# Patient Record
Sex: Male | Born: 1975 | Race: White | Hispanic: Yes | Smoking: Current every day smoker
Health system: Southern US, Community
[De-identification: ages and names within clinical notes are randomized; demographics above are authoritative.]

## PROBLEM LIST (undated history)

## (undated) DIAGNOSIS — I499 Cardiac arrhythmia, unspecified: Secondary | ICD-10-CM

---

## 2020-06-07 ENCOUNTER — Inpatient Hospital Stay (HOSPITAL_COMMUNITY)
Admission: EM | Admit: 2020-06-07 | Discharge: 2020-06-12 | DRG: 061 | Disposition: A | Discharge status: Home / Self Care | Payer: Self-pay | Attending: Neurology | Admitting: Neurology

## 2020-06-07 ENCOUNTER — Inpatient Hospital Stay (HOSPITAL_COMMUNITY): Payer: Self-pay

## 2020-06-07 ENCOUNTER — Emergency Department (HOSPITAL_COMMUNITY): Payer: Self-pay

## 2020-06-07 ENCOUNTER — Encounter (HOSPITAL_COMMUNITY): Payer: Self-pay

## 2020-06-07 ENCOUNTER — Other Ambulatory Visit: Payer: Self-pay

## 2020-06-07 DIAGNOSIS — R29705 NIHSS score 5: Secondary | ICD-10-CM | POA: Diagnosis present

## 2020-06-07 DIAGNOSIS — I4891 Unspecified atrial fibrillation: Secondary | ICD-10-CM

## 2020-06-07 DIAGNOSIS — Z20822 Contact with and (suspected) exposure to covid-19: Secondary | ICD-10-CM | POA: Diagnosis present

## 2020-06-07 DIAGNOSIS — I63432 Cerebral infarction due to embolism of left posterior cerebral artery: Principal | ICD-10-CM | POA: Diagnosis present

## 2020-06-07 DIAGNOSIS — I5041 Acute combined systolic (congestive) and diastolic (congestive) heart failure: Secondary | ICD-10-CM | POA: Diagnosis present

## 2020-06-07 DIAGNOSIS — I4819 Other persistent atrial fibrillation: Secondary | ICD-10-CM | POA: Diagnosis present

## 2020-06-07 DIAGNOSIS — D751 Secondary polycythemia: Secondary | ICD-10-CM | POA: Diagnosis present

## 2020-06-07 DIAGNOSIS — N179 Acute kidney failure, unspecified: Secondary | ICD-10-CM | POA: Diagnosis present

## 2020-06-07 DIAGNOSIS — Z597 Insufficient social insurance and welfare support: Secondary | ICD-10-CM

## 2020-06-07 DIAGNOSIS — I639 Cerebral infarction, unspecified: Secondary | ICD-10-CM | POA: Diagnosis present

## 2020-06-07 DIAGNOSIS — R29810 Facial weakness: Secondary | ICD-10-CM | POA: Diagnosis present

## 2020-06-07 DIAGNOSIS — R4781 Slurred speech: Secondary | ICD-10-CM | POA: Diagnosis present

## 2020-06-07 DIAGNOSIS — E785 Hyperlipidemia, unspecified: Secondary | ICD-10-CM | POA: Diagnosis present

## 2020-06-07 DIAGNOSIS — E860 Dehydration: Secondary | ICD-10-CM | POA: Diagnosis present

## 2020-06-07 DIAGNOSIS — H53461 Homonymous bilateral field defects, right side: Secondary | ICD-10-CM | POA: Diagnosis present

## 2020-06-07 DIAGNOSIS — F1010 Alcohol abuse, uncomplicated: Secondary | ICD-10-CM | POA: Diagnosis present

## 2020-06-07 DIAGNOSIS — I429 Cardiomyopathy, unspecified: Secondary | ICD-10-CM | POA: Diagnosis present

## 2020-06-07 DIAGNOSIS — I110 Hypertensive heart disease with heart failure: Secondary | ICD-10-CM | POA: Diagnosis present

## 2020-06-07 DIAGNOSIS — I513 Intracardiac thrombosis, not elsewhere classified: Secondary | ICD-10-CM | POA: Diagnosis present

## 2020-06-07 DIAGNOSIS — F17210 Nicotine dependence, cigarettes, uncomplicated: Secondary | ICD-10-CM | POA: Diagnosis present

## 2020-06-07 DIAGNOSIS — G8191 Hemiplegia, unspecified affecting right dominant side: Secondary | ICD-10-CM | POA: Diagnosis present

## 2020-06-07 DIAGNOSIS — E871 Hypo-osmolality and hyponatremia: Secondary | ICD-10-CM | POA: Diagnosis present

## 2020-06-07 HISTORY — DX: Cardiac arrhythmia, unspecified: I49.9

## 2020-06-07 LAB — COMPREHENSIVE METABOLIC PANEL
ALT: 59 U/L — ABNORMAL HIGH (ref 0–44)
AST: 69 U/L — ABNORMAL HIGH (ref 15–41)
Albumin: 3.9 g/dL (ref 3.5–5.0)
Alkaline Phosphatase: 59 U/L (ref 38–126)
Anion gap: 13 (ref 5–15)
BUN: 9 mg/dL (ref 6–20)
CO2: 20 mmol/L — ABNORMAL LOW (ref 22–32)
Calcium: 8.4 mg/dL — ABNORMAL LOW (ref 8.9–10.3)
Chloride: 100 mmol/L (ref 98–111)
Creatinine, Ser: 0.68 mg/dL (ref 0.61–1.24)
GFR, Estimated: >60 mL/min (ref >60)
Glucose, Bld: 125 mg/dL — ABNORMAL HIGH (ref 70–99)
Potassium: 3.7 mmol/L (ref 3.5–5.1)
Sodium: 133 mmol/L — ABNORMAL LOW (ref 135–145)
Total Bilirubin: 0.7 mg/dL (ref 0.3–1.2)
Total Protein: 7 g/dL (ref 6.5–8.1)

## 2020-06-07 LAB — CBC
HCT: 47.7 % (ref 39.0–52.0)
Hemoglobin: 16.1 g/dL (ref 13.0–17.0)
MCH: 30.1 pg (ref 26.0–34.0)
MCHC: 33.8 g/dL (ref 30.0–36.0)
MCV: 89.3 fL (ref 80.0–100.0)
Platelets: 142 10*3/uL — ABNORMAL LOW (ref 150–400)
RBC: 5.34 MIL/uL (ref 4.22–5.81)
RDW: 12.9 % (ref 11.5–15.5)
WBC: 5.6 10*3/uL (ref 4.0–10.5)
nRBC: 0 % (ref 0.0–0.2)

## 2020-06-07 LAB — DIFFERENTIAL
Abs Immature Granulocytes: 0.01 10*3/uL (ref 0.00–0.07)
Basophils Absolute: 0.1 10*3/uL (ref 0.0–0.1)
Basophils Relative: 1 %
Eosinophils Absolute: 0.1 10*3/uL (ref 0.0–0.5)
Eosinophils Relative: 1 %
Immature Granulocytes: 0 %
Lymphocytes Relative: 34 %
Lymphs Abs: 1.9 10*3/uL (ref 0.7–4.0)
Monocytes Absolute: 0.6 10*3/uL (ref 0.1–1.0)
Monocytes Relative: 11 %
Neutro Abs: 3 10*3/uL (ref 1.7–7.7)
Neutrophils Relative %: 53 %

## 2020-06-07 LAB — PROTIME-INR
INR: 1.2 (ref 0.8–1.2)
Prothrombin Time: 14.7 seconds (ref 11.4–15.2)

## 2020-06-07 LAB — CBG MONITORING, ED: Glucose-Capillary: 126 mg/dL — ABNORMAL HIGH (ref 70–99)

## 2020-06-07 LAB — RESP PANEL BY RT-PCR (FLU A&B, COVID) ARPGX2
Influenza A by PCR: NEGATIVE
Influenza B by PCR: NEGATIVE
SARS Coronavirus 2 by RT PCR: NEGATIVE

## 2020-06-07 LAB — APTT: aPTT: 24 seconds (ref 24–36)

## 2020-06-07 LAB — ETHANOL: Alcohol, Ethyl (B): 71 mg/dL — ABNORMAL HIGH (ref <10)

## 2020-06-07 LAB — MAGNESIUM: Magnesium: 2.1 mg/dL (ref 1.7–2.4)

## 2020-06-07 IMAGING — CT CT ANGIO NECK
2 of 7 series · 8 of 33 positions shown · IV contrast (APPLIED)
Comparison: None.

CLINICAL DATA: Stroke/TIA, assess extracranial arteries

EXAM:
CT ANGIOGRAPHY HEAD AND NECK
TECHNIQUE: Multidetector CT imaging of the head and neck was performed using
the standard protocol during bolus administration of intravenous
contrast. Multiplanar CT image reconstructions and MIPs were
obtained to evaluate the vascular anatomy. Carotid stenosis
measurements (when applicable) are obtained utilizing NASCET
criteria, using the distal internal carotid diameter as the
denominator.
CONTRAST:  75mL OMNIPAQUE IOHEXOL 350 MG/ML SOLN

[Series 7: cta neck/head · axial · 0.50mm/px · z∈[-212,-96]mm · 2 of 176 slices shown]
[im 59/176  soft-tissue]
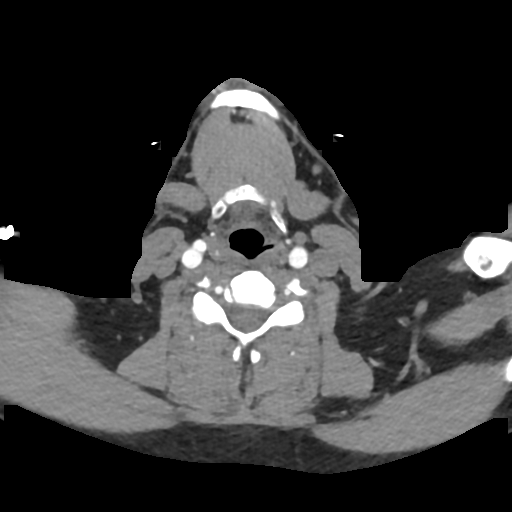
[im 117/176  soft-tissue]
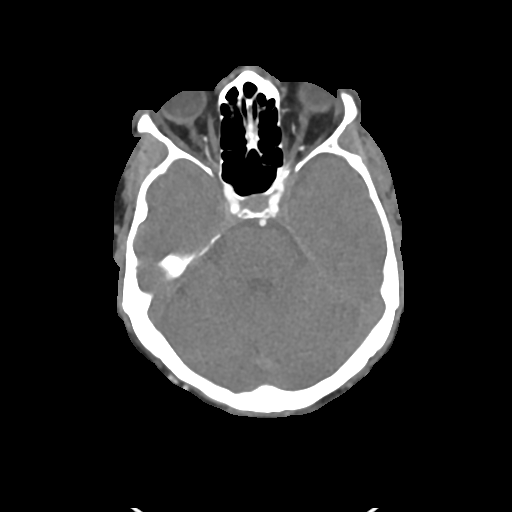

[Series 9: ax thins · axial · 0.38mm/px · z∈[-285,-36]mm · 6 of 352 slices shown]
[im 51/352  soft-tissue]
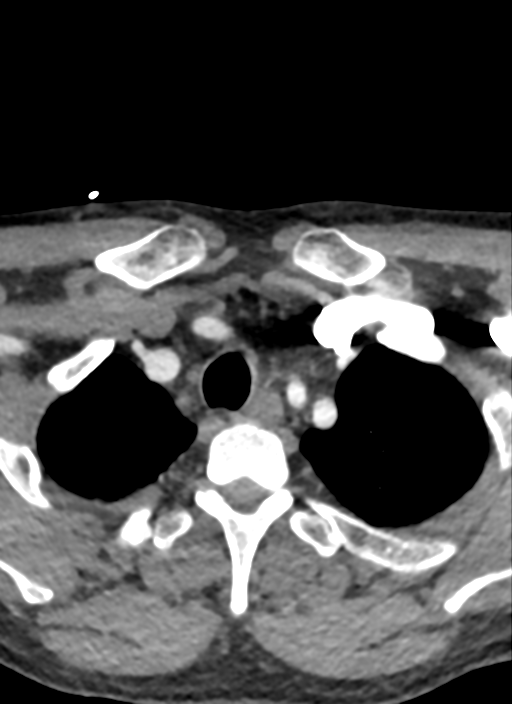
[im 101/352  bone]
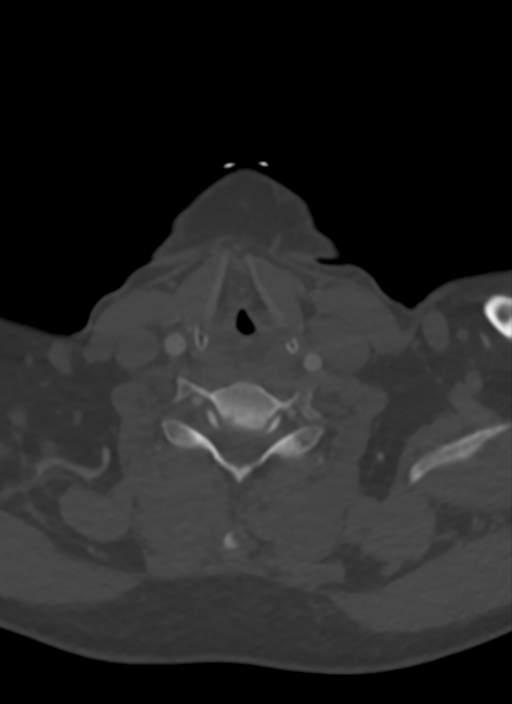
[im 151/352  soft-tissue]
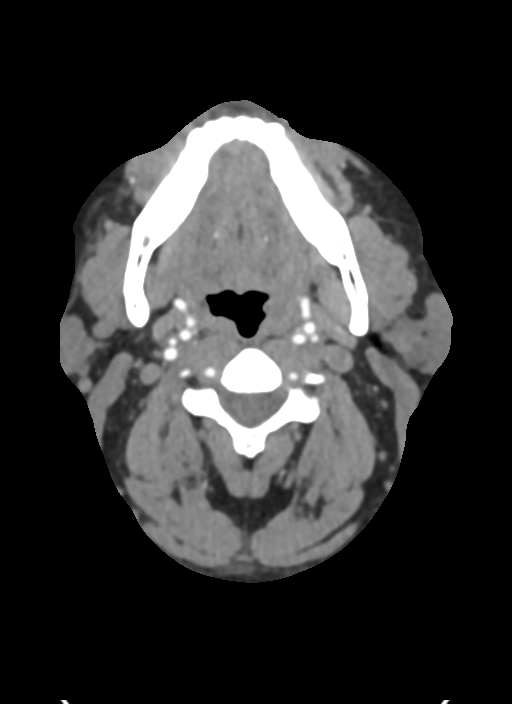
[im 201/352  bone]
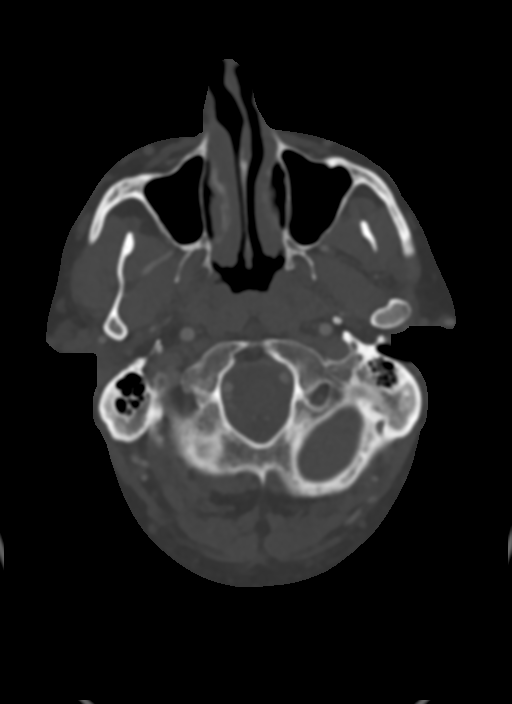
[im 251/352  soft-tissue]
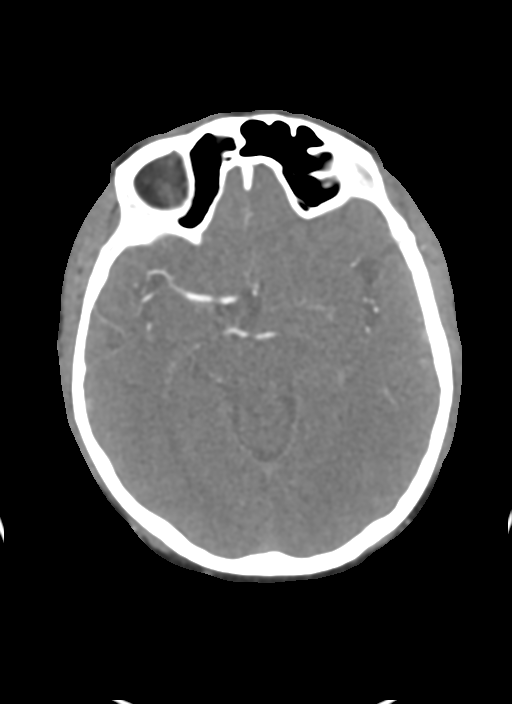
[im 301/352  bone]
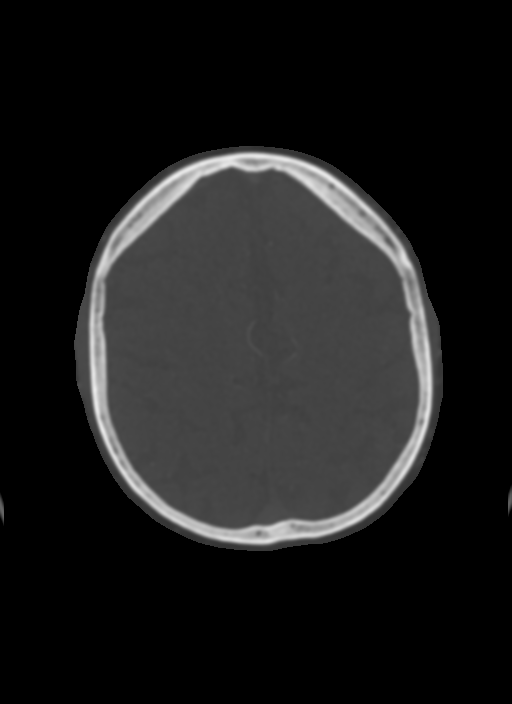

[8 of 33 positions shown; findings below may reference images not displayed]

FINDINGS: CT HEAD FINDINGS

Brain: There is no mass, hemorrhage or extra-axial collection. The
size and configuration of the ventricles and extra-axial CSF spaces
are normal. There is no acute or chronic infarction. The brain
parenchyma is normal.

Skull: The visualized skull base, calvarium and extracranial soft
tissues are normal.

Sinuses/Orbits: No fluid levels or advanced mucosal thickening of
the visualized paranasal sinuses. No mastoid or middle ear effusion.
The orbits are normal.

CTA NECK FINDINGS

SKELETON: There is no bony spinal canal stenosis. No lytic or
blastic lesion.

OTHER NECK: Normal pharynx, larynx and major salivary glands. No
cervical lymphadenopathy. Unremarkable thyroid gland.

UPPER CHEST: No pneumothorax or pleural effusion. No nodules or
masses.

AORTIC ARCH:

There is no calcific atherosclerosis of the aortic arch. There is no
aneurysm, dissection or hemodynamically significant stenosis of the
visualized portion of the aorta. Conventional 3 vessel aortic
branching pattern. The visualized proximal subclavian arteries are
widely patent.

RIGHT CAROTID SYSTEM: Normal without aneurysm, dissection or
stenosis.

LEFT CAROTID SYSTEM: Normal without aneurysm, dissection or
stenosis.

VERTEBRAL ARTERIES: Left dominant configuration. Both origins are
clearly patent. There is no dissection, occlusion or flow-limiting
stenosis to the skull base (V1-V3 segments).

CTA HEAD FINDINGS

POSTERIOR CIRCULATION:

--Vertebral arteries: Normal V4 segments.

--Inferior cerebellar arteries: Normal.

--Basilar artery: Normal.

--Superior cerebellar arteries: Normal.

--Posterior cerebral arteries (PCA): Distal left P2 segment
occlusion ([DATE]). Normal right.

ANTERIOR CIRCULATION:

--Intracranial internal carotid arteries: Normal.

--Anterior cerebral arteries (ACA): Normal. Both A1 segments are
present. Patent anterior communicating artery (a-comm).

--Middle cerebral arteries (MCA): Normal.

VENOUS SINUSES: As permitted by contrast timing, patent.

ANATOMIC VARIANTS: None

Review of the MIP images confirms the above findings.
IMPRESSION: 1. Distal left P2 segment occlusion.
2. No other intracranial arterial occlusion or high-grade stenosis.
3. Normal cervical carotid and vertebral arteries.

## 2020-06-07 IMAGING — CT CT HEAD CODE STROKE
4 series · 17 of 47 positions shown, 19 images · non-contrast
Comparison: None.

CLINICAL DATA: Code stroke. Acute neuro deficit. Slurred speech
right facial droop

EXAM:
CT HEAD WITHOUT CONTRAST
TECHNIQUE: Contiguous axial images were obtained from the base of the skull
through the vertex without intravenous contrast.

[Series 3: head bone · axial · 0.50mm/px · z∈[-58,-4]mm · 4 of 78 slices shown]
[im 8/78  bone]
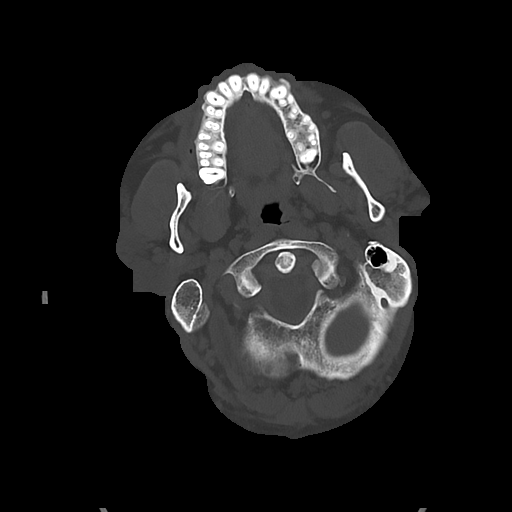
[im 16/78  bone]
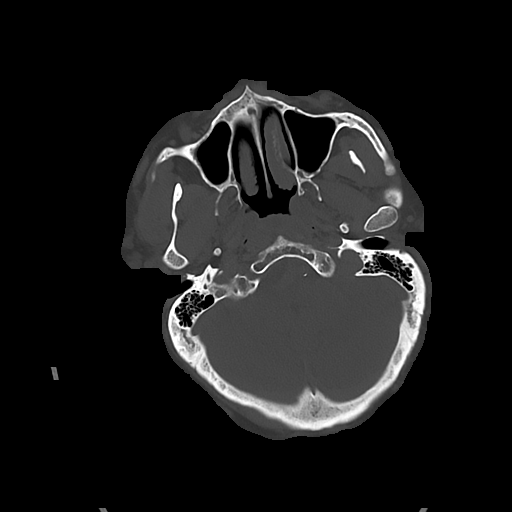
[im 24/78  bone]
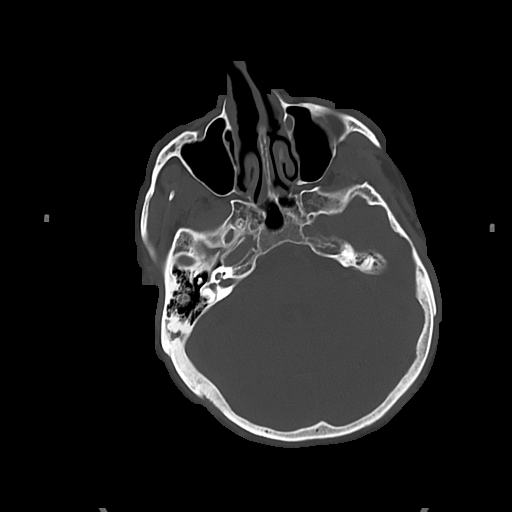
[im 35/78  bone]
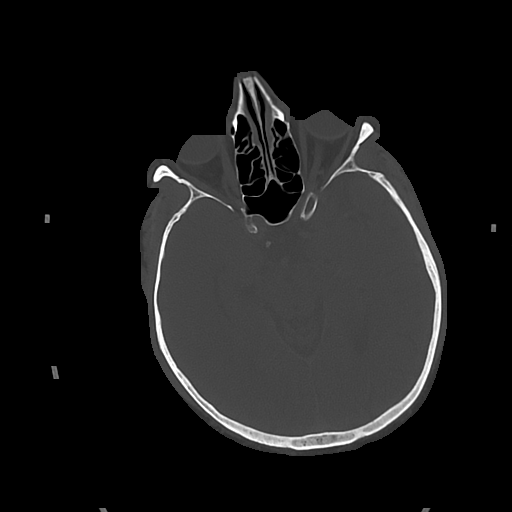

[Series 4: head wo · axial · 0.50mm/px · z∈[-58,+62]mm · 7 of 32 slices shown, 9 images]
[im 4/32  brain]
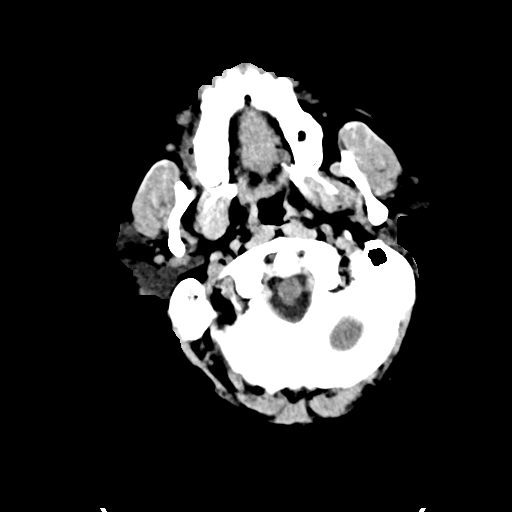
[im 4/32  bone]
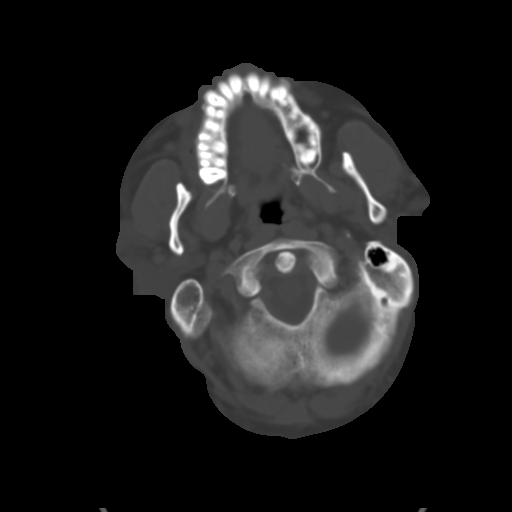
[im 8/32  brain]
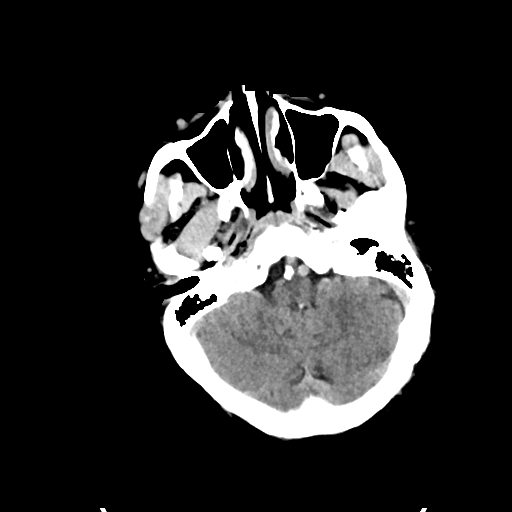
[im 12/32  brain]
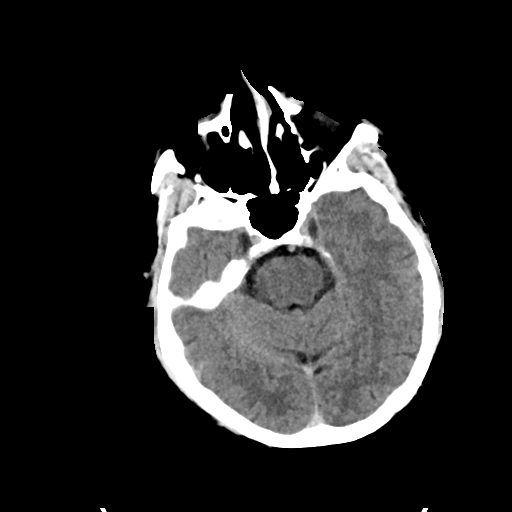
[im 16/32  brain]
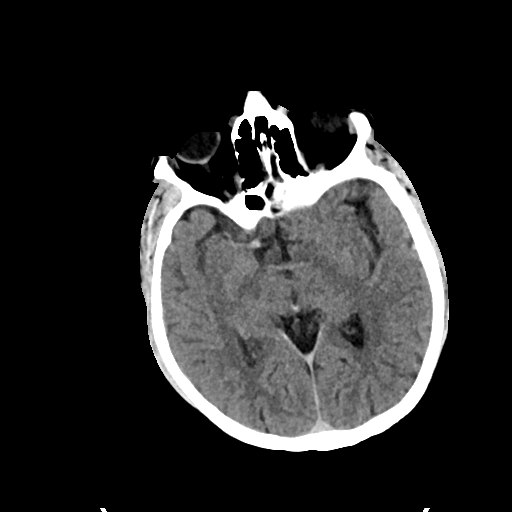
[im 20/32  brain]
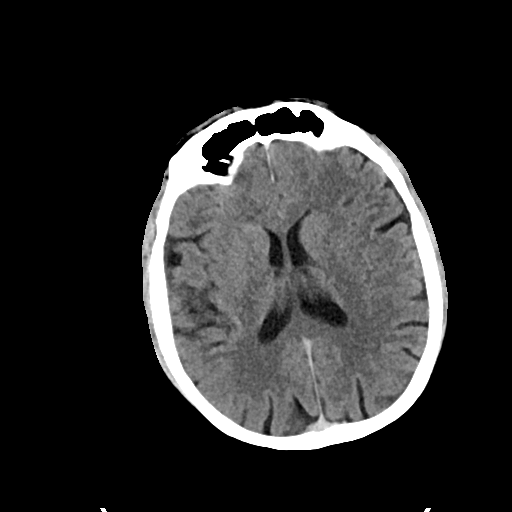
[im 20/32  bone]
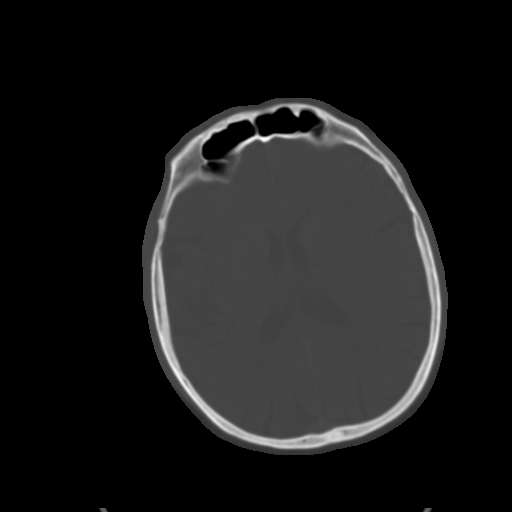
[im 24/32  brain]
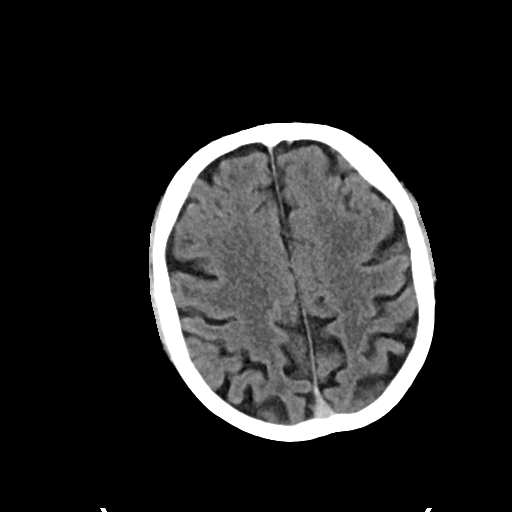
[im 28/32  brain]
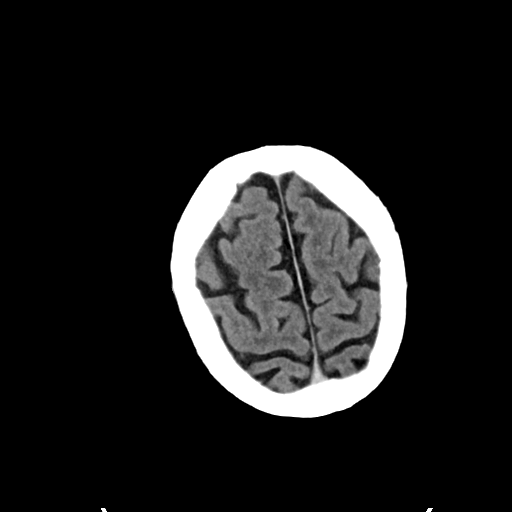

[Series 5: cor soft · coronal · 0.34mm/px · 3 of 71 slices shown]
[im 24/71  brain]
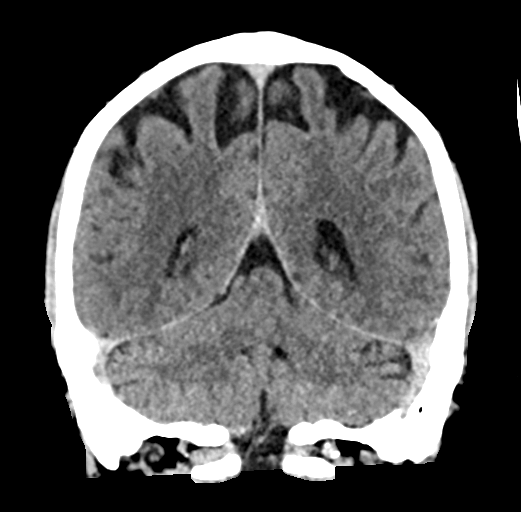
[im 32/71  brain]
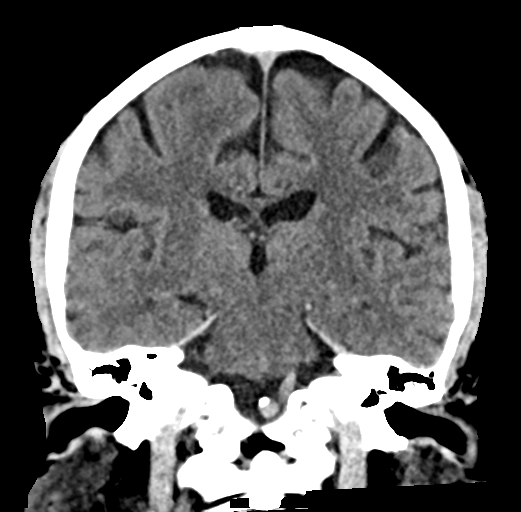
[im 39/71  brain]
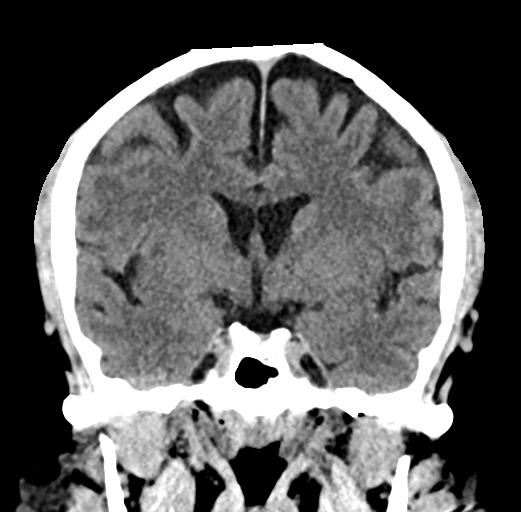

[Series 6: sag soft · sagittal · 0.37mm/px · 3 of 65 slices shown]
[im 22/65  brain]
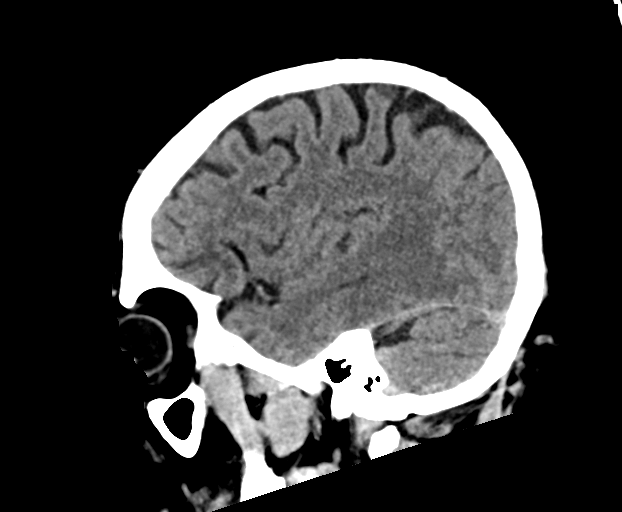
[im 33/65  brain]
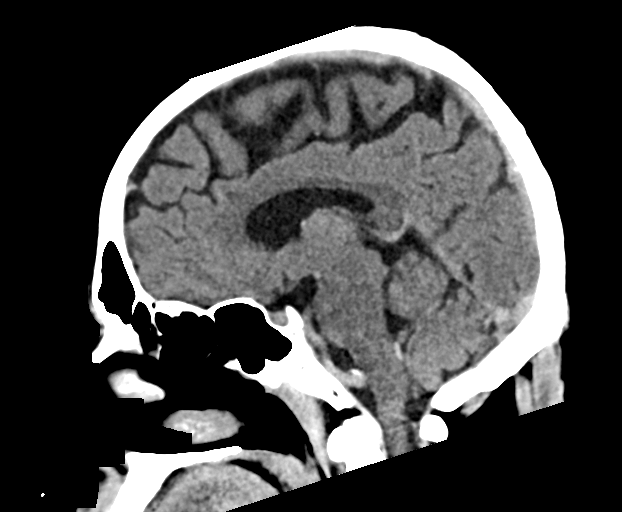
[im 43/65  brain]
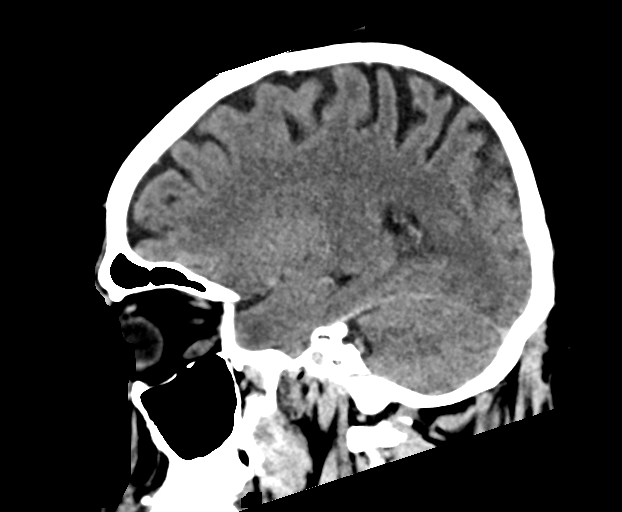

[17 of 47 positions shown; findings below may reference images not displayed]

FINDINGS: Brain: No evidence of acute infarction, hemorrhage, hydrocephalus,
extra-axial collection or mass lesion/mass effect.

Vascular: Atherosclerotic calcification distal vertebral arteries
bilaterally. Negative for hyperdense vessel

Skull: Negative

Sinuses/Orbits: Negative

Other: None

ASPECTS (Alberta Stroke Program Early CT Score)

- Ganglionic level infarction (caudate, lentiform nuclei, internal
capsule, insula, M1-M3 cortex): 7

- Supraganglionic infarction (M4-M6 cortex): 3

Total score (0-10 with 10 being normal): 10
IMPRESSION: 1. No acute intracranial abnormality.
2. ASPECTS is 10
3. Code stroke imaging results were communicated on [DATE] at
[DATE] to provider ABRA via text page

## 2020-06-07 IMAGING — MR MR HEAD W/O CM
7 of 11 series · 25 of 48 positions shown · non-contrast
Comparison: CTA head neck [DATE]

CLINICAL DATA: Right-sided weakness

EXAM:
MRI HEAD WITHOUT CONTRAST
TECHNIQUE: Multiplanar, multiecho pulse sequences of the brain and surrounding
structures were obtained without intravenous contrast.

[Series 2: DWI · axial · 3.0mm · 0.94mm/px · z∈[-80,+64]mm · 7 of 100 slices shown (1 of 2)]
[im 1/100]
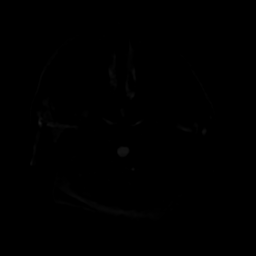
[im 17/100]
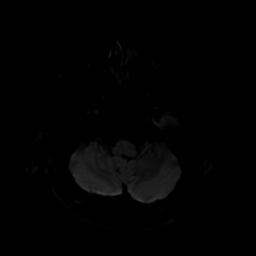
[im 34/100]
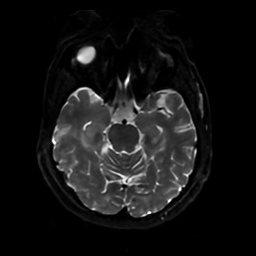
[im 50/100]
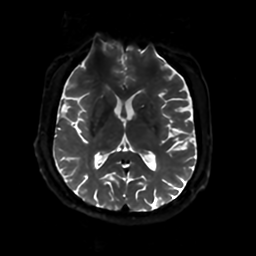
[im 67/100]
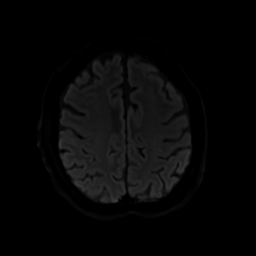
[im 83/100]
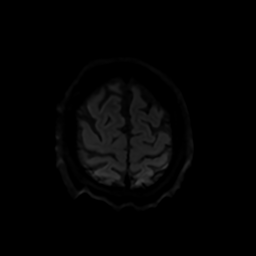
[im 100/100]
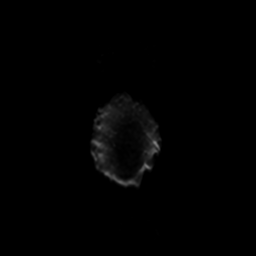

[Series 3: DWI · coronal · 4.0mm · 0.94mm/px · 6 of 70 slices shown (2 of 2)]
[im 1/70]
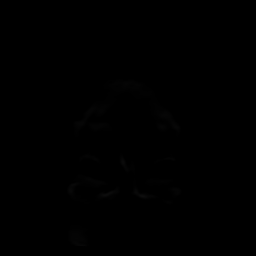
[im 14/70]
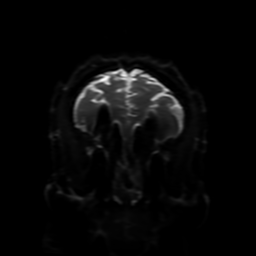
[im 28/70]
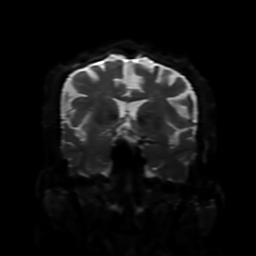
[im 42/70]
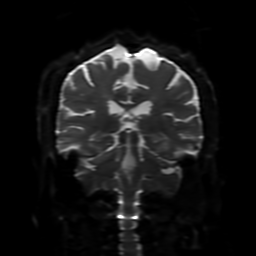
[im 56/70]
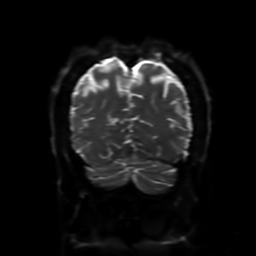
[im 70/70]
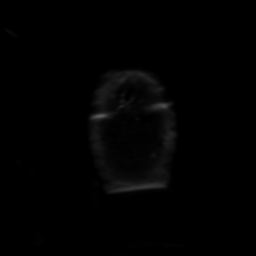

[Series 4: FLAIR · axial · 3.0mm · 0.45mm/px · z∈[-78,+63]mm · 2 of 25 slices shown (1 of 2)]
[im 1/25]
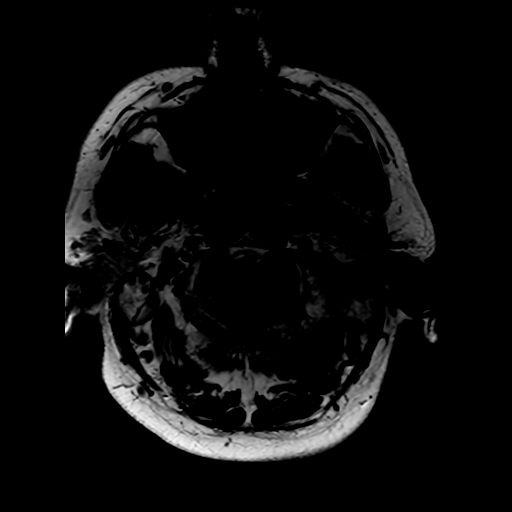
[im 25/25]
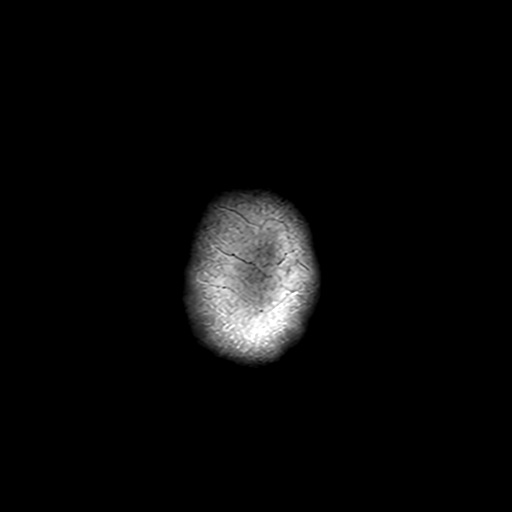

[Series 6: FLAIR · sagittal · 5.0mm · 0.23mm/px · 2 of 25 slices shown (2 of 2)]
[im 1/25]
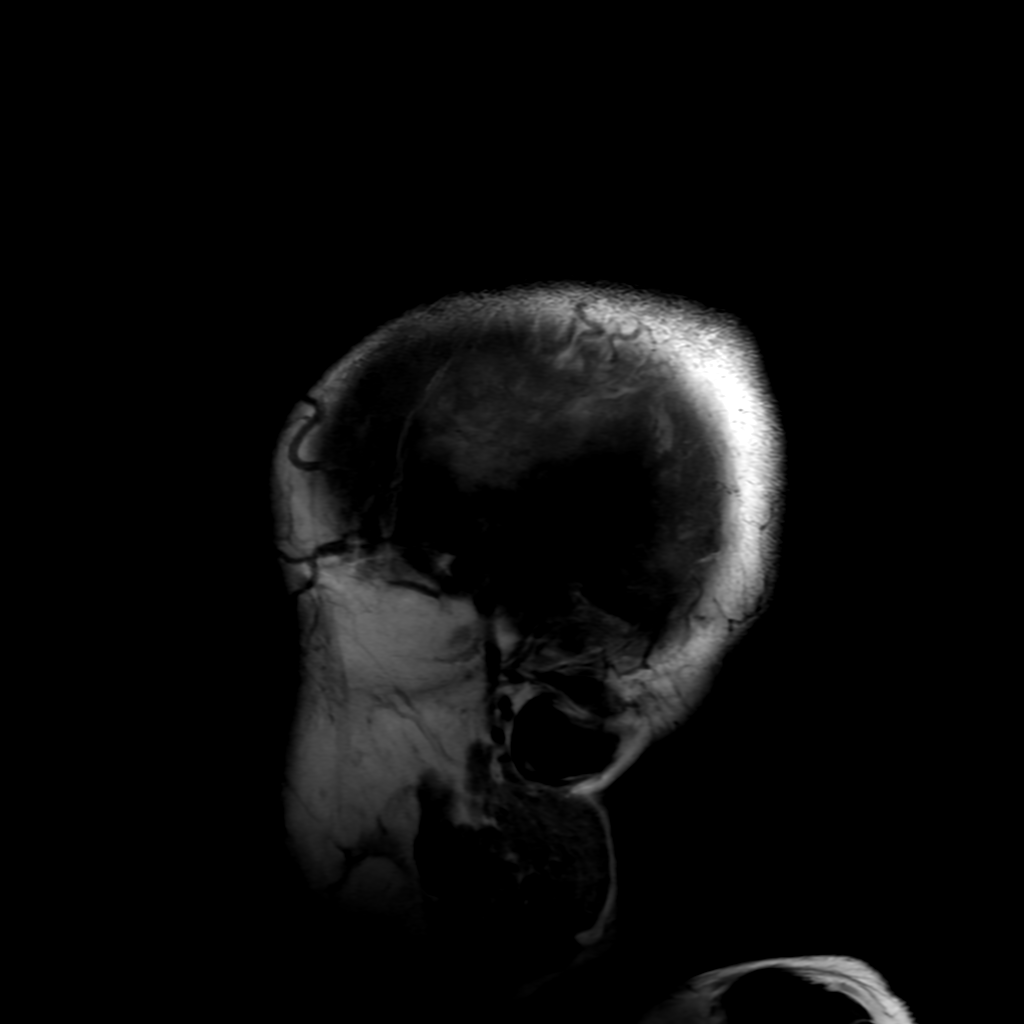
[im 25/25]
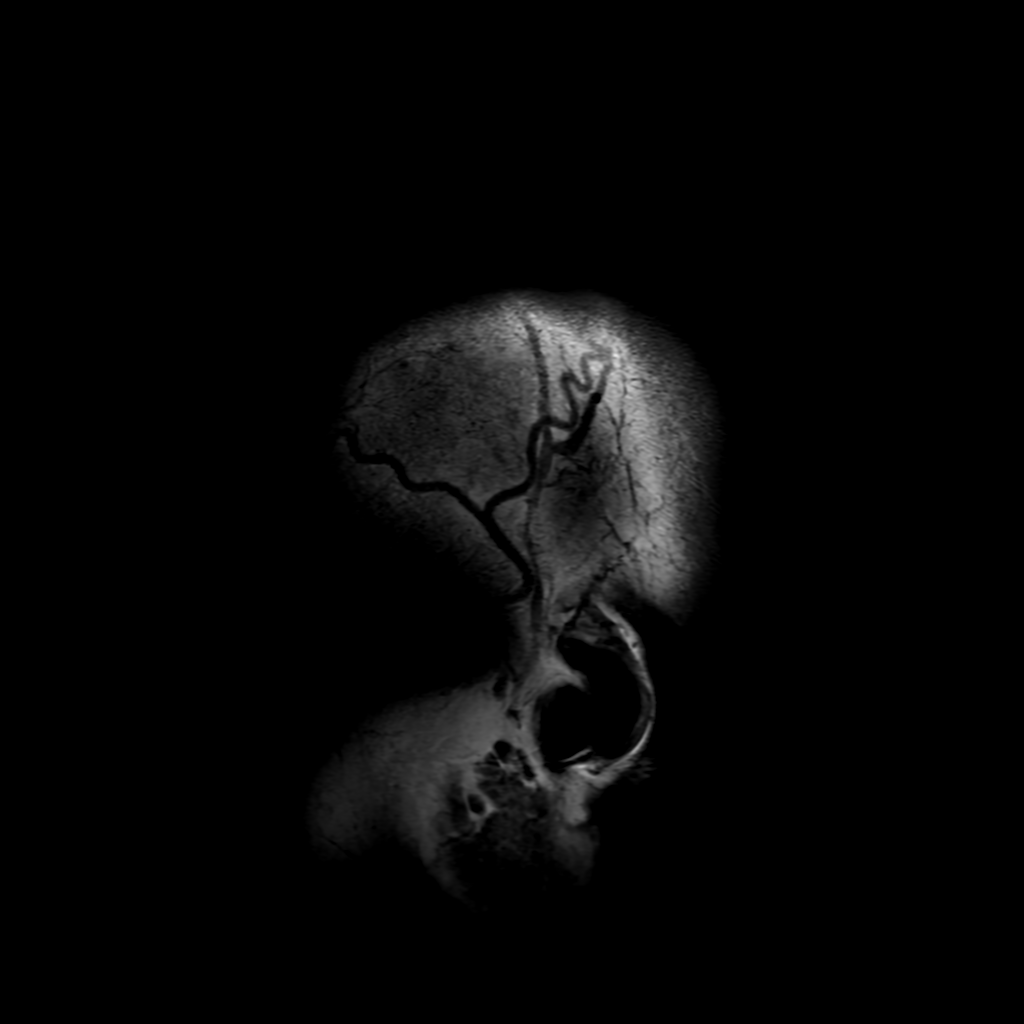

[Series 7: T2 · axial · 5.0mm · 0.23mm/px · 1 of 25 slices shown]
[im 1/25]
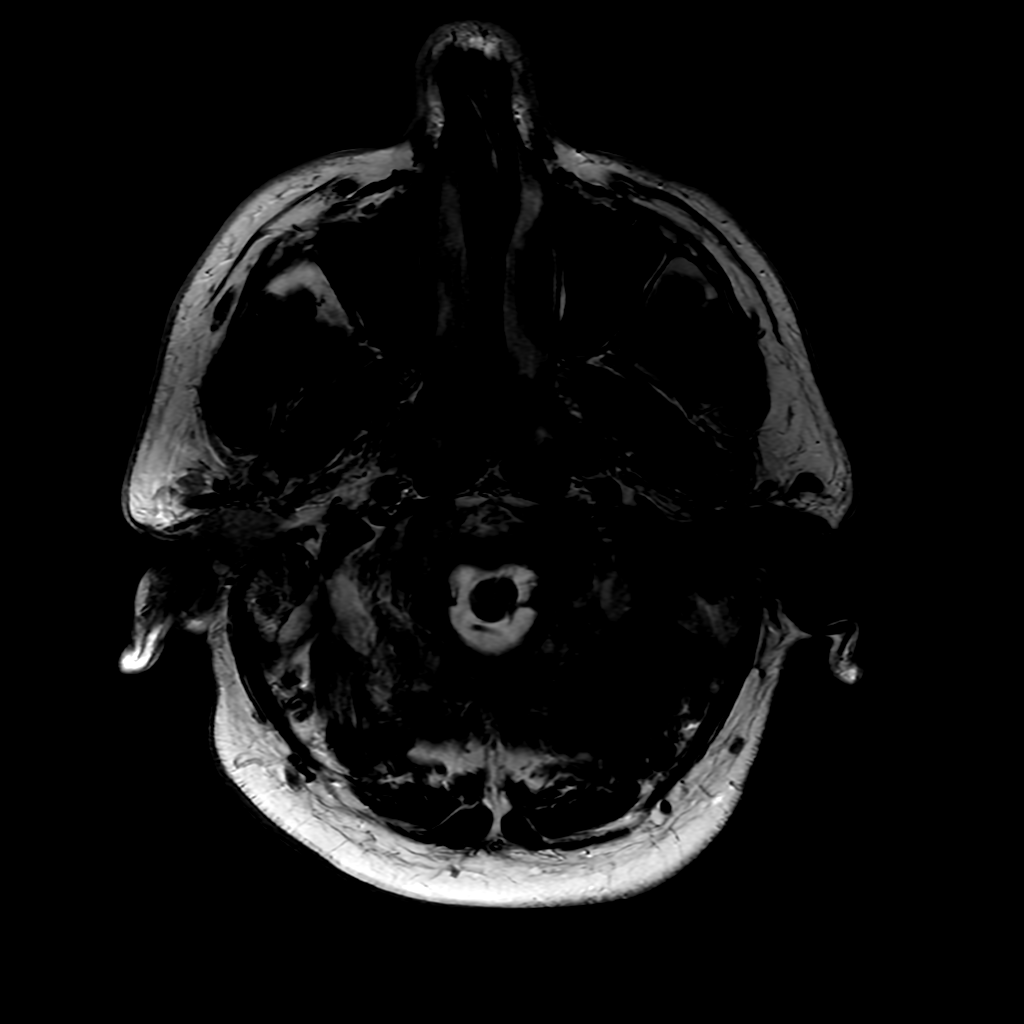

[Series 250: ADC · axial · 3.0mm · 0.94mm/px · z∈[-80,+64]mm · 4 of 50 slices shown (1 of 2)]
[im 1/50]
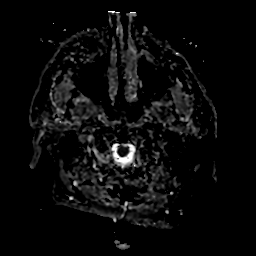
[im 17/50]
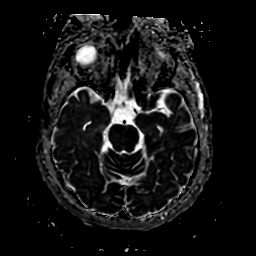
[im 33/50]
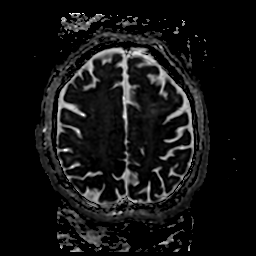
[im 50/50]
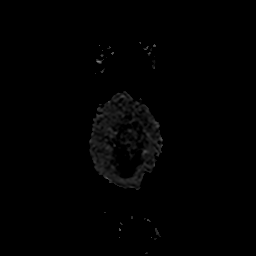

[Series 350: ADC · coronal · 4.0mm · 0.94mm/px · 3 of 32 slices shown (2 of 2)]
[im 1/32]
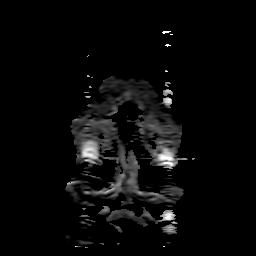
[im 16/32]
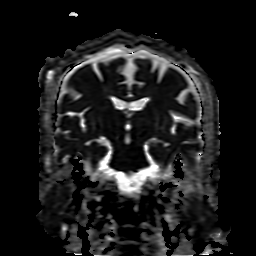
[im 32/32]
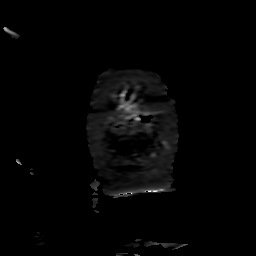

[25 of 48 positions shown; findings below may reference images not displayed]

FINDINGS: Brain: There are areas of abnormal diffusion restriction in the left
thalamus and medial temporal lobe. No acute or chronic hemorrhage.
There is multifocal hyperintense T2-weighted signal within the white
matter. Parenchymal volume and CSF spaces are normal. The midline
structures are normal.

Vascular: Major flow voids are preserved.

Skull and upper cervical spine: Normal calvarium and skull base.
Visualized upper cervical spine and soft tissues are normal.

Sinuses/Orbits:No paranasal sinus fluid levels or advanced mucosal
thickening. No mastoid or middle ear effusion. Normal orbits.
IMPRESSION: Small acute/subacute infarcts of the left thalamus and medial
temporal lobe, both within the left PCA territory. No hemorrhage or
mass effect.

## 2020-06-07 MED ORDER — SODIUM CHLORIDE 0.9% FLUSH
3.0000 mL | Freq: Once | INTRAVENOUS | Status: AC
  Administered 2020-06-07: 3 mL via INTRAVENOUS

## 2020-06-07 MED ORDER — ALTEPLASE (STROKE) FULL DOSE INFUSION
0.9000 mg/kg | Freq: Once | INTRAVENOUS | Status: AC
  Administered 2020-06-07: 81.9 mg via INTRAVENOUS
  Filled 2020-06-07: qty 100

## 2020-06-07 MED ORDER — STROKE: EARLY STAGES OF RECOVERY BOOK: Freq: Once | Status: AC

## 2020-06-07 MED ORDER — DILTIAZEM HCL-DEXTROSE 125-5 MG/125ML-% IV SOLN (PREMIX)
5.0000 mg/h | INTRAVENOUS | Status: DC
  Administered 2020-06-07: 5 mg/h via INTRAVENOUS
  Administered 2020-06-08: 15 mg/h via INTRAVENOUS
  Filled 2020-06-07 (×2): qty 125

## 2020-06-07 MED ORDER — LABETALOL HCL 5 MG/ML IV SOLN
5.0000 mg | INTRAVENOUS | Status: DC | PRN
  Administered 2020-06-07: 5 mg via INTRAVENOUS
  Filled 2020-06-07: qty 4

## 2020-06-07 MED ORDER — ACETAMINOPHEN 325 MG PO TABS
650.0000 mg | ORAL_TABLET | ORAL | Status: DC | PRN
  Administered 2020-06-09: 650 mg via ORAL
  Filled 2020-06-07: qty 2

## 2020-06-07 MED ORDER — ACETAMINOPHEN 650 MG RE SUPP: 650.0000 mg | RECTAL | Status: DC | PRN

## 2020-06-07 MED ORDER — PANTOPRAZOLE SODIUM 40 MG IV SOLR: 40.0000 mg | Freq: Every day | INTRAVENOUS | Status: DC

## 2020-06-07 MED ORDER — SENNOSIDES-DOCUSATE SODIUM 8.6-50 MG PO TABS: 1.0000 | ORAL_TABLET | Freq: Every evening | ORAL | Status: DC | PRN

## 2020-06-07 MED ORDER — LACTATED RINGERS IV BOLUS
500.0000 mL | Freq: Once | INTRAVENOUS | Status: AC
  Administered 2020-06-07: 500 mL via INTRAVENOUS

## 2020-06-07 MED ORDER — ACETAMINOPHEN 160 MG/5ML PO SOLN: 650.0000 mg | ORAL | Status: DC | PRN

## 2020-06-07 MED ORDER — SODIUM CHLORIDE 0.9 % IV SOLN
50.0000 mL | Freq: Once | INTRAVENOUS | Status: AC
  Administered 2020-06-07: 50 mL via INTRAVENOUS

## 2020-06-07 MED ORDER — SODIUM CHLORIDE 0.9 % IV SOLN: INTRAVENOUS | Status: DC

## 2020-06-07 MED ORDER — IOHEXOL 350 MG/ML SOLN
75.0000 mL | Freq: Once | INTRAVENOUS | Status: AC | PRN
  Administered 2020-06-07: 75 mL via INTRAVENOUS

## 2020-06-07 MED ORDER — CHLORHEXIDINE GLUCONATE CLOTH 2 % EX PADS
6.0000 | MEDICATED_PAD | Freq: Every day | CUTANEOUS | Status: DC
  Administered 2020-06-08 – 2020-06-12 (×4): 6 via TOPICAL

## 2020-06-07 NOTE — ED Notes (Signed)
Md Bhagat notified pt passed swallow screen

## 2020-06-07 NOTE — ED Triage Notes (Signed)
Pt was at work, the coworker noticed a lost of coordiantion in his right arm followed by right leg. Somebody noticed a right facial droop, and slurred speech from the pt. Per EMS, pt has irregular fast heartbeats between 112-190.

## 2020-06-07 NOTE — ED Notes (Signed)
Md Bhagat notified pt is at max dose of cardizem and still has a high hr of 160

## 2020-06-07 NOTE — Progress Notes (Signed)
eLink Physician-Brief Progress Note Patient Name: Jason Gibbs DOB: 1975-07-11 MRN: [id number]   Date of Service  06/07/2020  HPI/Events of Note  74 M ETOH and tobacco abuse presented with right sided weakness s/p TPA. CTA with distal L P2 occlusion. He ws in afib RVR started on cardizem drip.  BP 122/70  HR 93  O2 92%  eICU Interventions  Repeat imaging in am Normotension goal For 2D echo, will need anticoagulation     Intervention Category Major Interventions: Other:;Arrhythmia - evaluation and management Evaluation Type: New Patient Evaluation  Darl Pikes 06/07/2020, 11:25 PM

## 2020-06-07 NOTE — ED Provider Notes (Signed)
[facility] EMERGENCY DEPARTMENT Provider Note   CSN: [id number] Arrival date & time: 06/07/20  1740  An emergency department physician performed an initial assessment on this suspected stroke patient at 1748.  History Chief Complaint  Patient presents with  . Code Stroke    Jason Gibbs is a 45 y.o. male.  The history is provided by the patient, medical records and the EMS personnel.   Jason Gibbs is a 45 y.o. male who presents to the Emergency Department complaining of stroke. L5 caveat due to acuity of condition. History is provided by EMS. He presents the emergency department by EMS as a code stroke. He was at work at [employer] when coworkers noticed at 4 PM he was not able to move his right arm properly was having difficulty with walking due to right-sided weakness. He has no known medical problems. He smokes tobacco drinks 4 to 6 beers daily.    History reviewed. No pertinent past medical history.  Patient Active Problem List   Diagnosis Date Noted  . Stroke determined by clinical assessment (HCC) 06/07/2020  . Atrial fibrillation with RVR (HCC)     History reviewed. No pertinent surgical history.     History reviewed. No pertinent family history.  Social History   Tobacco Use  . Smoking status: Current Every Day Smoker    Types: Cigarettes  . Tobacco comment: 3 a day   Substance Use Topics  . Alcohol use: Yes    Alcohol/week: 6.0 standard drinks    Types: 6 Cans of beer per week    Comment: 6 beers daily   . Drug use: Yes    Types: Marijuana    Comment: once in awhile     Home Medications Prior to Admission medications   Not on File    Allergies    Patient has no known allergies.  Review of Systems   Review of Systems  All other systems reviewed and are negative.   Physical Exam Updated Vital Signs BP 111/77   Pulse 100   Temp 98.7 F (37.1 C) (Oral)   Resp (!) 23   Wt 91 kg   SpO2 91%   Physical Exam Vitals and  nursing note reviewed.  Constitutional:      Appearance: He is well-developed.  HENT:     Head: Normocephalic and atraumatic.  Cardiovascular:     Rate and Rhythm: Tachycardia present. Rhythm irregular.     Heart sounds: No murmur heard.   Pulmonary:     Effort: Pulmonary effort is normal. No respiratory distress.     Breath sounds: Normal breath sounds.  Abdominal:     Palpations: Abdomen is soft.     Tenderness: There is no abdominal tenderness. There is no guarding or rebound.  Musculoskeletal:        General: No tenderness.  Skin:    General: Skin is warm and dry.  Neurological:     Mental Status: He is alert and oriented to person, place, and time.     Comments: 4 to 5 strength in the right upper extremity, 4+ out of five strength in the right lower extremity. Five out of five strength in left upper and left lower extremities. Mild right facial droop.  Psychiatric:        Behavior: Behavior normal.     ED Results / Procedures / Treatments   Labs (all labs ordered are listed, but only abnormal results are displayed) Labs Reviewed  CBC - Abnormal; Notable  for the following components:      Result Value   Platelets 142 (*)    All other components within normal limits  COMPREHENSIVE METABOLIC PANEL - Abnormal; Notable for the following components:   Sodium 133 (*)    CO2 20 (*)    Glucose, Bld 125 (*)    Calcium 8.4 (*)    AST 69 (*)    ALT 59 (*)    All other components within normal limits  ETHANOL - Abnormal; Notable for the following components:   Alcohol, Ethyl (B) 71 (*)    All other components within normal limits  CBG MONITORING, ED - Abnormal; Notable for the following components:   Glucose-Capillary 126 (*)    All other components within normal limits  RESP PANEL BY RT-PCR (FLU A&B, COVID) ARPGX2  MRSA PCR SCREENING  DIFFERENTIAL  MAGNESIUM  PROTIME-INR  APTT  TSH  HEMOGLOBIN A1C  LIPID PANEL  HIV ANTIBODY (ROUTINE TESTING W REFLEX)  I-STAT CHEM  8, ED    EKG EKG Interpretation  Date/Time:  Monday June 07 2020 18:30:35 EDT Ventricular Rate:  159 PR Interval:    QRS Duration: 88 QT Interval:  295 QTC Calculation: 480 R Axis:   113 Text Interpretation: Atrial fibrillation Right axis deviation Borderline ST elevation, anterior leads Borderline prolonged QT interval Confirmed by Tilden Fossaees, Dayani Winbush [id number]) on 06/07/2020 6:33:07 PM   Radiology CT ANGIO HEAD W OR WO CONTRAST  Result Date: 06/07/2020 CLINICAL DATA:  Stroke/TIA, assess extracranial arteries EXAM: CT ANGIOGRAPHY HEAD AND NECK TECHNIQUE: Multidetector CT imaging of the head and neck was performed using the standard protocol during bolus administration of intravenous contrast. Multiplanar CT image reconstructions and MIPs were obtained to evaluate the vascular anatomy. Carotid stenosis measurements (when applicable) are obtained utilizing NASCET criteria, using the distal internal carotid diameter as the denominator. CONTRAST:  75mL OMNIPAQUE IOHEXOL 350 MG/ML SOLN COMPARISON:  None. FINDINGS: CT HEAD FINDINGS Brain: There is no mass, hemorrhage or extra-axial collection. The size and configuration of the ventricles and extra-axial CSF spaces are normal. There is no acute or chronic infarction. The brain parenchyma is normal. Skull: The visualized skull base, calvarium and extracranial soft tissues are normal. Sinuses/Orbits: No fluid levels or advanced mucosal thickening of the visualized paranasal sinuses. No mastoid or middle ear effusion. The orbits are normal. CTA NECK FINDINGS SKELETON: There is no bony spinal canal stenosis. No lytic or blastic lesion. OTHER NECK: Normal pharynx, larynx and major salivary glands. No cervical lymphadenopathy. Unremarkable thyroid gland. UPPER CHEST: No pneumothorax or pleural effusion. No nodules or masses. AORTIC ARCH: There is no calcific atherosclerosis of the aortic arch. There is no aneurysm, dissection or hemodynamically significant stenosis  of the visualized portion of the aorta. Conventional 3 vessel aortic branching pattern. The visualized proximal subclavian arteries are widely patent. RIGHT CAROTID SYSTEM: Normal without aneurysm, dissection or stenosis. LEFT CAROTID SYSTEM: Normal without aneurysm, dissection or stenosis. VERTEBRAL ARTERIES: Left dominant configuration. Both origins are clearly patent. There is no dissection, occlusion or flow-limiting stenosis to the skull base (V1-V3 segments). CTA HEAD FINDINGS POSTERIOR CIRCULATION: --Vertebral arteries: Normal V4 segments. --Inferior cerebellar arteries: Normal. --Basilar artery: Normal. --Superior cerebellar arteries: Normal. --Posterior cerebral arteries (PCA): Distal left P2 segment occlusion (14:23). Normal right. ANTERIOR CIRCULATION: --Intracranial internal carotid arteries: Normal. --Anterior cerebral arteries (ACA): Normal. Both A1 segments are present. Patent anterior communicating artery (a-comm). --Middle cerebral arteries (MCA): Normal. VENOUS SINUSES: As permitted by contrast timing, patent. ANATOMIC VARIANTS: None Review of  the MIP images confirms the above findings. IMPRESSION: 1. Distal left P2 segment occlusion. 2. No other intracranial arterial occlusion or high-grade stenosis. 3. Normal cervical carotid and vertebral arteries. Electronically Signed   By: Deatra Robinson M.D.   On: 06/07/2020 21:22   CT ANGIO NECK W OR WO CONTRAST  Result Date: 06/07/2020 CLINICAL DATA:  Stroke/TIA, assess extracranial arteries EXAM: CT ANGIOGRAPHY HEAD AND NECK TECHNIQUE: Multidetector CT imaging of the head and neck was performed using the standard protocol during bolus administration of intravenous contrast. Multiplanar CT image reconstructions and MIPs were obtained to evaluate the vascular anatomy. Carotid stenosis measurements (when applicable) are obtained utilizing NASCET criteria, using the distal internal carotid diameter as the denominator. CONTRAST:  24mL OMNIPAQUE IOHEXOL 350  MG/ML SOLN COMPARISON:  None. FINDINGS: CT HEAD FINDINGS Brain: There is no mass, hemorrhage or extra-axial collection. The size and configuration of the ventricles and extra-axial CSF spaces are normal. There is no acute or chronic infarction. The brain parenchyma is normal. Skull: The visualized skull base, calvarium and extracranial soft tissues are normal. Sinuses/Orbits: No fluid levels or advanced mucosal thickening of the visualized paranasal sinuses. No mastoid or middle ear effusion. The orbits are normal. CTA NECK FINDINGS SKELETON: There is no bony spinal canal stenosis. No lytic or blastic lesion. OTHER NECK: Normal pharynx, larynx and major salivary glands. No cervical lymphadenopathy. Unremarkable thyroid gland. UPPER CHEST: No pneumothorax or pleural effusion. No nodules or masses. AORTIC ARCH: There is no calcific atherosclerosis of the aortic arch. There is no aneurysm, dissection or hemodynamically significant stenosis of the visualized portion of the aorta. Conventional 3 vessel aortic branching pattern. The visualized proximal subclavian arteries are widely patent. RIGHT CAROTID SYSTEM: Normal without aneurysm, dissection or stenosis. LEFT CAROTID SYSTEM: Normal without aneurysm, dissection or stenosis. VERTEBRAL ARTERIES: Left dominant configuration. Both origins are clearly patent. There is no dissection, occlusion or flow-limiting stenosis to the skull base (V1-V3 segments). CTA HEAD FINDINGS POSTERIOR CIRCULATION: --Vertebral arteries: Normal V4 segments. --Inferior cerebellar arteries: Normal. --Basilar artery: Normal. --Superior cerebellar arteries: Normal. --Posterior cerebral arteries (PCA): Distal left P2 segment occlusion (14:23). Normal right. ANTERIOR CIRCULATION: --Intracranial internal carotid arteries: Normal. --Anterior cerebral arteries (ACA): Normal. Both A1 segments are present. Patent anterior communicating artery (a-comm). --Middle cerebral arteries (MCA): Normal. VENOUS  SINUSES: As permitted by contrast timing, patent. ANATOMIC VARIANTS: None Review of the MIP images confirms the above findings. IMPRESSION: 1. Distal left P2 segment occlusion. 2. No other intracranial arterial occlusion or high-grade stenosis. 3. Normal cervical carotid and vertebral arteries. Electronically Signed   By: Deatra Robinson M.D.   On: 06/07/2020 21:22   MR BRAIN WO CONTRAST  Result Date: 06/07/2020 CLINICAL DATA:  Right-sided weakness EXAM: MRI HEAD WITHOUT CONTRAST TECHNIQUE: Multiplanar, multiecho pulse sequences of the brain and surrounding structures were obtained without intravenous contrast. COMPARISON:  CTA head neck 06/07/2020 FINDINGS: Brain: There are areas of abnormal diffusion restriction in the left thalamus and medial temporal lobe. No acute or chronic hemorrhage. There is multifocal hyperintense T2-weighted signal within the white matter. Parenchymal volume and CSF spaces are normal. The midline structures are normal. Vascular: Major flow voids are preserved. Skull and upper cervical spine: Normal calvarium and skull base. Visualized upper cervical spine and soft tissues are normal. Sinuses/Orbits:No paranasal sinus fluid levels or advanced mucosal thickening. No mastoid or middle ear effusion. Normal orbits. IMPRESSION: Small acute/subacute infarcts of the left thalamus and medial temporal lobe, both within the left PCA territory. No hemorrhage or  mass effect. Electronically Signed   By: Deatra Robinson M.D.   On: 06/07/2020 22:01   CT HEAD CODE STROKE WO CONTRAST  Result Date: 06/07/2020 CLINICAL DATA:  Code stroke. Acute neuro deficit. Slurred speech right facial droop EXAM: CT HEAD WITHOUT CONTRAST TECHNIQUE: Contiguous axial images were obtained from the base of the skull through the vertex without intravenous contrast. COMPARISON:  None. FINDINGS: Brain: No evidence of acute infarction, hemorrhage, hydrocephalus, extra-axial collection or mass lesion/mass effect. Vascular:  Atherosclerotic calcification distal vertebral arteries bilaterally. Negative for hyperdense vessel Skull: Negative Sinuses/Orbits: Negative Other: None ASPECTS (Alberta Stroke Program Early CT Score) - Ganglionic level infarction (caudate, lentiform nuclei, internal capsule, insula, M1-M3 cortex): 7 - Supraganglionic infarction (M4-M6 cortex): 3 Total score (0-10 with 10 being normal): 10 IMPRESSION: 1. No acute intracranial abnormality. 2. ASPECTS is 10 3. Code stroke imaging results were communicated on 06/07/2020 at 6:03 pm to provider Bhagat via text page Electronically Signed   By: Marlan Palau M.D.   On: 06/07/2020 18:04    Procedures Procedures  CRITICAL CARE Performed by: Tilden Fossa   Total critical care time: 40 minutes  Critical care time was exclusive of separately billable procedures and treating other patients.  Critical care was necessary to treat or prevent imminent or life-threatening deterioration.  Critical care was time spent personally by me on the following activities: development of treatment plan with patient and/or surrogate as well as nursing, discussions with consultants, evaluation of patient's response to treatment, examination of patient, obtaining history from patient or surrogate, ordering and performing treatments and interventions, ordering and review of laboratory studies, ordering and review of radiographic studies, pulse oximetry and re-evaluation of patient's condition.  Medications Ordered in ED Medications  diltiazem (CARDIZEM) 125 mg in dextrose 5% 125 mL (1 mg/mL) infusion (15 mg/hr Intravenous Infusion Verify 06/07/20 2230)  labetalol (NORMODYNE) injection 5 mg (5 mg Intravenous Given 06/07/20 2238)   stroke: mapping our early stages of recovery book (has no administration in time range)  acetaminophen (TYLENOL) tablet 650 mg (has no administration in time range)    Or  acetaminophen (TYLENOL) 160 MG/5ML solution 650 mg (has no administration in  time range)    Or  acetaminophen (TYLENOL) suppository 650 mg (has no administration in time range)  senna-docusate (Senokot-S) tablet 1 tablet (has no administration in time range)  pantoprazole (PROTONIX) injection 40 mg (has no administration in time range)  Chlorhexidine Gluconate Cloth 2 % PADS 6 each (has no administration in time range)  sodium chloride flush (NS) 0.9 % injection 3 mL (3 mLs Intravenous Given 06/07/20 1938)  alteplase (ACTIVASE) 1 mg/mL infusion 81.9 mg (0 mg Intravenous Stopped 06/07/20 1910)    Followed by  0.9 %  sodium chloride infusion (0 mLs Intravenous Stopped 06/07/20 1939)  iohexol (OMNIPAQUE) 350 MG/ML injection 75 mL (75 mLs Intravenous Contrast Given 06/07/20 2105)  lactated ringers bolus 500 mL (500 mLs Intravenous New Bag/Given 06/07/20 2245)    ED Course  I have reviewed the triage vital signs and the nursing notes.  Pertinent labs & imaging results that were available during my care of the patient were reviewed by me and considered in my medical decision making (see chart for details).    MDM Rules/Calculators/A&P                          Patient presented to the emergency department as a code stroke for new onset right-sided weakness. He  is noted to be in a fib with RVR. He was evaluated by neurology on ED presentation. He is a candidate for TPA and this was treated given. Will start Cardizem for rate control due to a fib with RVR, heart rates in the 160s. Plan to admit to the neurology service for ongoing treatment. Final Clinical Impression(s) / ED Diagnoses Final diagnoses:  Acute ischemic stroke [facility])  Atrial fibrillation with RVR [facility])    Rx / DC Orders ED Discharge Orders    None       Tilden Fossa, MD 06/07/20 2357

## 2020-06-07 NOTE — H&P (Addendum)
Neurology H&P Reason for Consult: Right arm and leg weakness Requesting Physician: Marin Roberts  CC: Right-sided weakness and numbness  History is obtained from: Patient and EMS  HPI: Jason Gibbs is a 45 y.o. male with a past medical history significant for daily alcohol use, lack of regular medical care, ongoing smoking (3 to 4 cigarettes daily), presenting with acute onset right-sided weakness.  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  He additionally had numbness in the right arm and leg, but denies any vision issues, speech or language issues.  He reports no known history of aneurysm; brain masses, brain surgery, bleeding in his brain, bleeding anywhere else in his body including urine, stool, vomiting blood or nosebleeds.  He reports no recent procedures, no recent heart attack  LKW: 4 PM tPA given?: No, or if yes, time given 18:10  There was some delay due to needing an interpreter to explain risks and benefits of tPA and perform the tPA checklist accurately Premorbid modified rankin scale:      0 - No symptoms.    ROS: Limited review of systems given multiple acute code strokes ongoing simultaneously, pertinent positives as above  History reviewed. No pertinent past medical history.  History reviewed. No pertinent family history.  Social History:  reports that he has been smoking cigarettes. He does not have any smokeless tobacco history on file. He reports current alcohol use of about 6.0 standard drinks of alcohol per week. He reports current drug use. Drug: Marijuana.  To me he reports that he drinks 3-4 beers nightly, his never had any issues with withdrawal, and smokes marijuana occasionally with his last use being over one month ago.  Exam: Current vital signs: BP (!) 123/96   Pulse (!) 130    Temp 98.6 F (37 C)   Resp 15   Wt 91 kg   SpO2 95%  Vital signs in last 24 hours: Temp:  [98.4 F (36.9 C)-98.6 F (37 C)] 98.6 F (37 C) (04/18 1921) Pulse Rate:  [83-164] 130 (04/18 1921) Resp:  [15-17] 15 (04/18 1921) BP: (122-139)/(89-102) 123/96 (04/18 1921) SpO2:  [95 %-96 %] 95 % (04/18 1921) Weight:  [91 kg] 91 kg (04/18 1700)   Physical Exam  Constitutional: Appears well-developed and well-nourished, obese.  Psych: Affect appropriate to situation, calm and cooperative Eyes: No scleral injection HENT: No oropharyngeal obstruction, fair dentition.  MSK: no joint deformities.  Cardiovascular: Tachycardic but rate appears regular Respiratory: Effort normal, non-labored breathing GI: Soft.  No distension. There is no tenderness.  Skin: Warm dry and intact visible skin  Neuro: Mental Status: Patient is awake, alert, oriented to person, place, month, year, and situation. Patient is able to give a clear and coherent history within limits of language barrier/need for interpretation No signs of aphasia or neglect Cranial Nerves: II: Visual Fields are full. Pupils are equal, round, and reactive to light.   III,IV, VI: EOMI without ptosis or diploplia.  V: Facial sensation is symmetric to light touch VII: Facial movement is notable for a mild right facial droop VIII: hearing is intact to voice X: Uvula elevates symmetrically XII: tongue is midline without atrophy or fasciculations.  Motor: Bulk is normal.  Right arm has some intermittent slight drift and clear pronation, no pronator drift of the left upper extremity.  Able  to maintain both right lower and left lower extremity antigravity for at least 5 seconds Sensory: Sensation is reduced in the right arm and right leg Deep Tendon Reflexes: 2+ and symmetric in the biceps and patellae.  Cerebellar: FNF and HKS are intact on the left but dysmetric on both right arm and right leg  NIHSS total 5 Score breakdown: 1 point  for facial paresis, 1 point for right arm drift, 2 points for right arm and leg ataxia, 1 point for sensory loss on the right  I have reviewed labs in epic and the results pertinent to this consultation are: creatinine verbally reported to me by the laboratory at 0.68 CBC notable for mildly reduced platelets at 142  I have reviewed the images obtained: Head CT without acute intracranial abnormality  EKG notable for A. fib with RVR  Impression: This is a 45 year old man with no significant known past medical history with ongoing alcohol and tobacco use presenting with acute onset right-sided weakness and numbness without clinical signs concerning for subcortical stroke on the left.  Treated with tPA given he is within the window, though likely embolic source given his atrial fibrillation with rapid ventricular rate  Recommendations: - Stroke labs TSH, ESR, HgbA1c, fasting lipid panel - MRI brain   - CTA head and neck - Frequent neuro checks - Echocardiogram - Decision on when to start anticoagulation after MRI brain completed - Risk factor modification - Telemetry monitoring; appreciate CCM involvement for rate control  -Appreciate ED assistance with starting diltiazem - Blood pressure goal   - Post tPA for 24  hours < 180/105   Notably patient has been normotensive throughout this episode, labetalol 5 mg every 10 minutes ordered as needed - Monitor for alcohol withdrawal with CIWA's protocol given daily use - PT consult, OT consult, Speech consult - Admitted to the stroke team      Lesleigh Noe MD-PhD [facility] [phone] Available 7 AM to 7 PM, outside these hours please contact Neurologist on call listed on AMION   Total critical care time: 42 minutes   Critical care time was exclusive of separately billable procedures and treating other patients.   Critical care was necessary to treat or prevent imminent or life-threatening deterioration.   Critical  care was time spent personally by me on the following activities: development of treatment plan with patient and/or surrogate as well as nursing, discussions with consultants/primary team, evaluation of patient's response to treatment, examination of patient, obtaining history from patient or surrogate, ordering and performing treatments and interventions, ordering and review of laboratory studies, ordering and review of radiographic studies, and re-evaluation of patient's condition as needed, as documented above.

## 2020-06-07 NOTE — Progress Notes (Signed)
PHARMACIST CODE STROKE RESPONSE  Notified to mix tPA at 1806 by Dr. Iver Nestle Delivered tPA to RN at 1809  tPA dose = 8.2 mg bolus over 1 minute followed by 73.7mg  for a total dose of 81.9mg  over 1 hour  Issues/delays encountered (if applicable):  Jason Gibbs 06/07/20 7:12 PM

## 2020-06-07 NOTE — ED Notes (Signed)
Patient transported to CT 

## 2020-06-07 NOTE — ED Notes (Signed)
Pt still at mri unable to obtain vitals

## 2020-06-07 NOTE — Code Documentation (Signed)
Stroke Response Nurse Documentation Code Documentation  Jason Gibbs is a 45 y.o. male arriving to [facility] ED via [facility] on 06/07/2020. Code stroke was activated by EMS. Patient from work where he was LKW at 1600 and now complaining of right facial droop, right hand weakness, and difficulty ambulating. On No antithrombotic. Stroke team at the bedside on patient arrival. Labs drawn and patient cleared for CT by Dr. Wilkie Aye. Patient to CT with team. NIHSS 5, see documentation for details and code stroke times. Patient with right facial droop, right arm weakness, right limb ataxia and right decreased sensation on exam. The following imaging was completed: CT. Patient is a candidate for tPA.  Care/Plan:  q15 min VS & NIHSS  Bedside handoff with ED RN Lenard Forth Shellee Streng  Rapid Response RN

## 2020-06-07 NOTE — ED Notes (Addendum)
Pt still at [facility]. Report given to Abby,RN on 4N.

## 2020-06-07 NOTE — Consult Note (Signed)
NAME:  Jason Gibbs, MRN:  [id number], DOB:  [birth year], LOS: 0 ADMISSION DATE:  06/07/2020, CONSULTATION DATE:  06/07/20 REFERRING MD:  Iver Nestle, CHIEF COMPLAINT:   R-sided weakness  History of Present Illness:  45 y.o. M with PMH tobacco and alcohol use who was at work when he developed R facial droop, R hand weakness and gait difficulty. Last known well 1600 on 4/18.  He was taken to the ED as a code stroke and received TPA.  Initial head CT was without acute findings.  Labs with slightly elevated LFT's  and Na 133.    He was evaluated and admitted by neurology.   CTA was obtained with distal L P2 occlusion and cardiac rhythm.   He was in atrial fibrillation with RVR during ED course and was started on Cardizem gtt.  He had no hypotension or electrolyte abnormalities.  He remained in RVR despite maximum Cardizem gtt, so PCCM was consulted.  Pt is awake and alert, states that he has intermittently had palpitations for years and sometimes has exertional shortness of breath.  He denies current palpitations or dyspnea and is stable on RA.  Denies chest pain  Pertinent  Medical History  Tobacco and alcohol use  Significant Hospital Events: Including procedures, antibiotic start and stop dates in addition to other pertinent events   . 4/18 Presented to ED, given TPA, PCCM consult for afib  Interim History / Subjective:  Pt awake and asymptomatic from RVR  Objective   Blood pressure (!) 155/134, pulse 78, temperature 98.8 F (37.1 C), resp. rate 16, weight 91 kg, SpO2 91 %.       No intake or output data in the 24 hours ending 06/07/20 2116 Filed Weights   06/07/20 1700  Weight: 91 kg    General:  Well-nourished, middle aged M sitting in bed watching TV in no distress HEENT: MM pink/moist, no facial droop, pupils equal Neuro: awake, alert, conversational without facial droop, dysarthria or confusion CV: s1s2 tachycardic and irregular, no m/r/g PULM:  Clear bilaterally GI: soft, bsx4  active  Extremities: warm/dry, no edema  Skin: no rashes or lesions   Labs/imaging that I havepersonally reviewed  (right click and "Reselect all SmartList Selections" daily)  CBC Metabolic panel Magnesium level  Resolved Hospital Problem list     Assessment & Plan:    Afib/Aflutter with RVR Pt has minimal interaction with the medical system, so unclear chronicity Currently in RVR, but remains normotensive P: -Continue cardizem gtt with prn Labetalol -if this fails to control RVR then can change to amiodarone -post TPA BP goal <180/105 -follow TSH -Follow Echo -Chads2vasc score is two, however unclear what chronic conditions pt may have, per neuro decision regarding anti-coagulation following MRI   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 Recent Labs  Lab 06/07/20 1747  WBC 5.6    Liver Function Tests: Recent Labs  Lab 06/07/20 1900  AST 69*  ALT 59*  ALKPHOS 59  BILITOT 0.7  PROT 7.0  ALBUMIN 3.9   No results for input(s): LIPASE, AMYLASE in the last 168 hours. No results for input(s): AMMONIA in the last 168 hours.  ABG No results found for: PHART, PCO2ART, PO2ART, HCO3, TCO2, ACIDBASEDEF, O2SAT   Coagulation Profile: Recent Labs  Lab 06/07/20 1900  INR 1.2    Cardiac Enzymes: No results for input(s): CKTOTAL, CKMB, CKMBINDEX, TROPONINI in the last 168  hours.  HbA1C: No results found for: HGBA1C  CBG: Recent Labs  Lab 06/07/20 1748  GLUCAP 126*    Review of Systems:    Review of Systems - Negative except for dyspnea and palpitations and prior R-sided weakness  Past Medical History:  ETOH and tobacco abuse  Surgical History:  History reviewed. No pertinent surgical history.   Social History:   reports that he has been smoking cigarettes. He does not have any smokeless tobacco history on file. He reports current alcohol use of about 6.0 standard drinks of alcohol per week. He reports current drug use. Drug: Marijuana.   Family History:  His family history is not on file.   Allergies Not on File   Home Medications  Prior to Admission medications   Not on File     Critical care time:  35 minutes   Darcella Gasman Keyen Marban, PA-C [facility] See Amion for pager If no response to pager , please call [phone] until 7pm After 7:00 pm call Elink  [phone]

## 2020-06-07 NOTE — ED Notes (Signed)
Patient transported to MRI 

## 2020-06-08 ENCOUNTER — Inpatient Hospital Stay (HOSPITAL_COMMUNITY): Payer: Self-pay

## 2020-06-08 DIAGNOSIS — F1010 Alcohol abuse, uncomplicated: Secondary | ICD-10-CM

## 2020-06-08 DIAGNOSIS — I255 Ischemic cardiomyopathy: Secondary | ICD-10-CM

## 2020-06-08 DIAGNOSIS — F17200 Nicotine dependence, unspecified, uncomplicated: Secondary | ICD-10-CM

## 2020-06-08 DIAGNOSIS — Z9282 Status post administration of tPA (rtPA) in a different facility within the last 24 hours prior to admission to current facility: Secondary | ICD-10-CM

## 2020-06-08 DIAGNOSIS — I5040 Unspecified combined systolic (congestive) and diastolic (congestive) heart failure: Secondary | ICD-10-CM

## 2020-06-08 DIAGNOSIS — I6389 Other cerebral infarction: Secondary | ICD-10-CM

## 2020-06-08 LAB — BASIC METABOLIC PANEL
Anion gap: 11 (ref 5–15)
BUN: 5 mg/dL — ABNORMAL LOW (ref 6–20)
CO2: 21 mmol/L — ABNORMAL LOW (ref 22–32)
Calcium: 8.9 mg/dL (ref 8.9–10.3)
Chloride: 103 mmol/L (ref 98–111)
Creatinine, Ser: 0.68 mg/dL (ref 0.61–1.24)
GFR, Estimated: >60 mL/min (ref >60)
Glucose, Bld: 115 mg/dL — ABNORMAL HIGH (ref 70–99)
Potassium: 4 mmol/L (ref 3.5–5.1)
Sodium: 135 mmol/L (ref 135–145)

## 2020-06-08 LAB — RAPID URINE DRUG SCREEN, HOSP PERFORMED
Amphetamines: NOT DETECTED
Barbiturates: NOT DETECTED
Benzodiazepines: NOT DETECTED
Cocaine: NOT DETECTED
Opiates: NOT DETECTED
Tetrahydrocannabinol: NOT DETECTED

## 2020-06-08 LAB — CBC
HCT: 50.9 % (ref 39.0–52.0)
HCT: 51.8 % (ref 39.0–52.0)
Hemoglobin: 17.5 g/dL — ABNORMAL HIGH (ref 13.0–17.0)
Hemoglobin: 17.7 g/dL — ABNORMAL HIGH (ref 13.0–17.0)
MCH: 30.2 pg (ref 26.0–34.0)
MCH: 30.1 pg (ref 26.0–34.0)
MCHC: 34.4 g/dL (ref 30.0–36.0)
MCHC: 34.2 g/dL (ref 30.0–36.0)
MCV: 87.9 fL (ref 80.0–100.0)
MCV: 87.9 fL (ref 80.0–100.0)
Platelets: 153 10*3/uL (ref 150–400)
Platelets: 159 10*3/uL (ref 150–400)
RBC: 5.79 MIL/uL (ref 4.22–5.81)
RBC: 5.89 MIL/uL — ABNORMAL HIGH (ref 4.22–5.81)
RDW: 12.8 % (ref 11.5–15.5)
RDW: 12.8 % (ref 11.5–15.5)
WBC: 6.7 10*3/uL (ref 4.0–10.5)
WBC: 6.1 10*3/uL (ref 4.0–10.5)
nRBC: 0 % (ref 0.0–0.2)
nRBC: 0 % (ref 0.0–0.2)

## 2020-06-08 LAB — ECHOCARDIOGRAM COMPLETE
MV M vel: 4.4 m/s
MV Peak grad: 77.4 mmHg
S' Lateral: 4.1 cm
Weight: 3209.9 oz

## 2020-06-08 LAB — HEMOGLOBIN A1C
Hgb A1c MFr Bld: 5.6 % (ref 4.8–5.6)
Mean Plasma Glucose: 114.02 mg/dL

## 2020-06-08 LAB — HIV ANTIBODY (ROUTINE TESTING W REFLEX): HIV Screen 4th Generation wRfx: NONREACTIVE

## 2020-06-08 LAB — TSH: TSH: 1.451 u[IU]/mL (ref 0.350–4.500)

## 2020-06-08 LAB — LIPID PANEL
Cholesterol: 214 mg/dL — ABNORMAL HIGH (ref 0–200)
HDL: 44 mg/dL (ref >40)
LDL Cholesterol: 149 mg/dL — ABNORMAL HIGH (ref 0–99)
Total CHOL/HDL Ratio: 4.9 RATIO
Triglycerides: 104 mg/dL (ref <150)
VLDL: 21 mg/dL (ref 0–40)

## 2020-06-08 LAB — PHOSPHORUS: Phosphorus: 2.8 mg/dL (ref 2.5–4.6)

## 2020-06-08 LAB — MAGNESIUM: Magnesium: 2 mg/dL (ref 1.7–2.4)

## 2020-06-08 LAB — MRSA PCR SCREENING: MRSA by PCR: NEGATIVE

## 2020-06-08 IMAGING — MR MR HEAD W/O CM
6 series · 39 of 48 positions shown · non-contrast
Comparison: [DATE]

CLINICAL DATA: Stroke follow-up

EXAM:
MRI HEAD WITHOUT CONTRAST
TECHNIQUE: Multiplanar, multiecho pulse sequences of the brain and surrounding
structures were obtained without intravenous contrast.

[Series 3: DWI · axial · 3.0mm · 1.09mm/px · z∈[-78,+64]mm · 9 of 99 slices shown (1 of 4)]
[im 1/99]
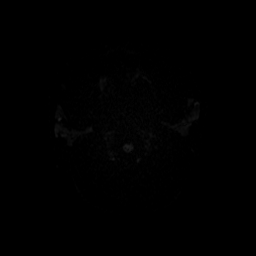
[im 17/99]
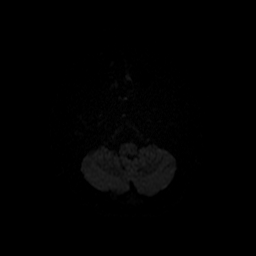
[im 33/99]
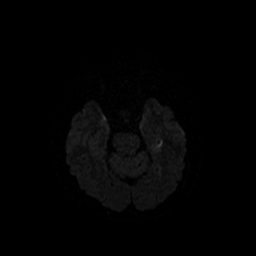
[im 41/99]
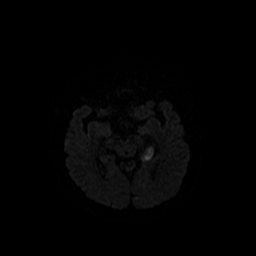
[im 50/99]
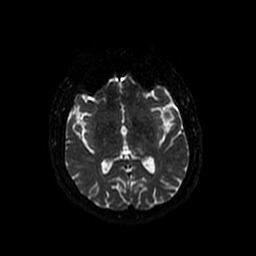
[im 58/99]
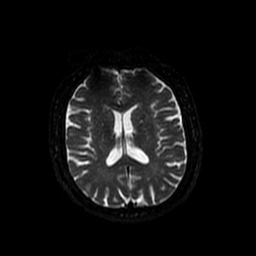
[im 66/99]
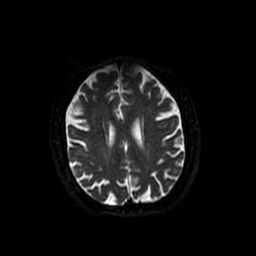
[im 82/99]
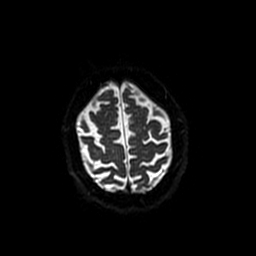
[im 99/99]
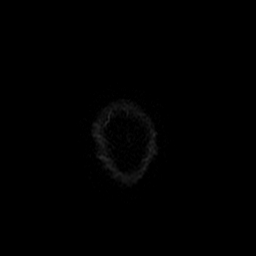

[Series 4: DWI · coronal · 5.0mm · 1.09mm/px · 9 of 66 slices shown (2 of 4)]
[im 1/66]
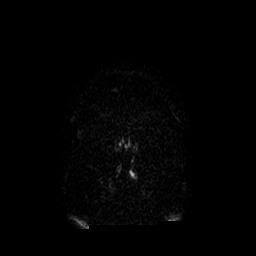
[im 9/66]
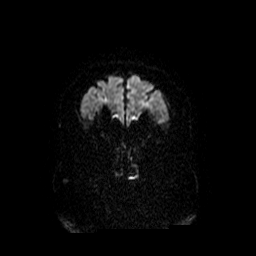
[im 17/66]
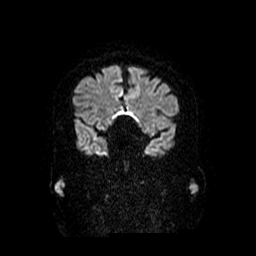
[im 25/66]
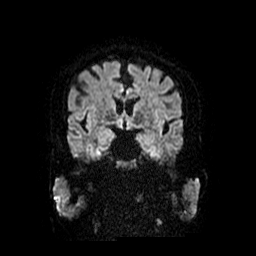
[im 33/66]
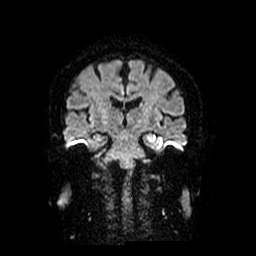
[im 41/66]
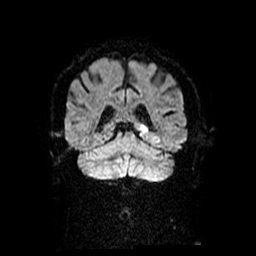
[im 49/66]
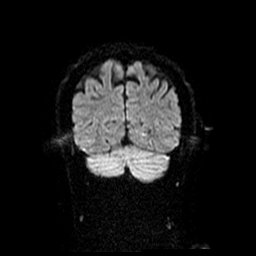
[im 57/66]
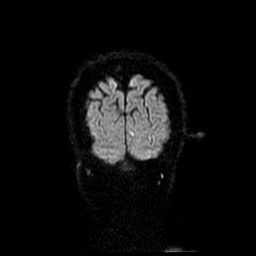
[im 66/66]
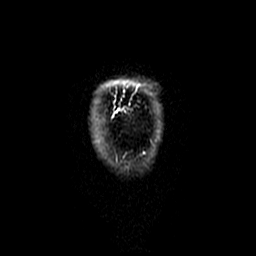

[Series 5: ax mpgr · axial · 5.0mm · 0.45mm/px · z∈[-77,+71]mm · 3 of 23 slices shown]
[im 1/23]
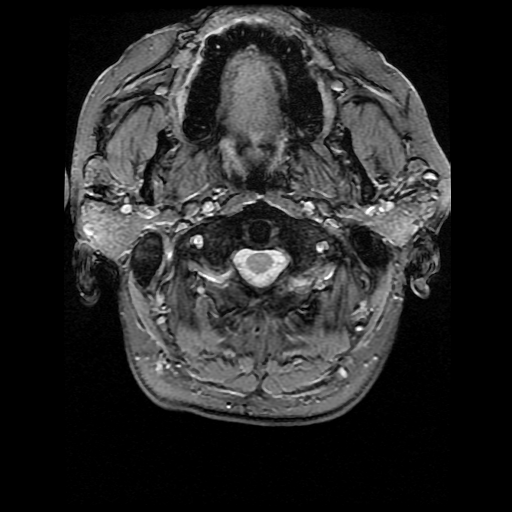
[im 12/23]
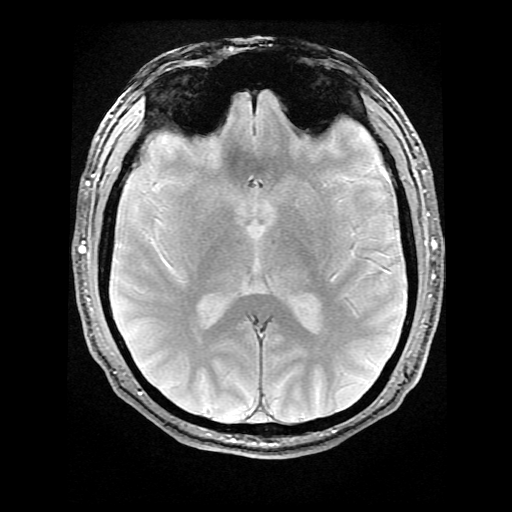
[im 23/23]
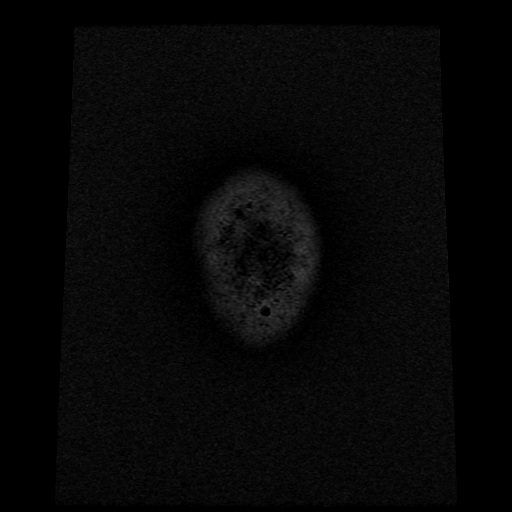

[Series 6: T1 · axial · 3.0mm · 0.47mm/px · z∈[-78,+64]mm · 8 of 100 slices shown]
[im 1/100]
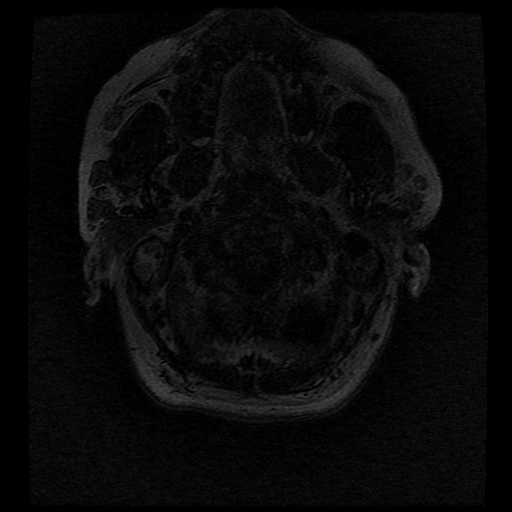
[im 17/100]
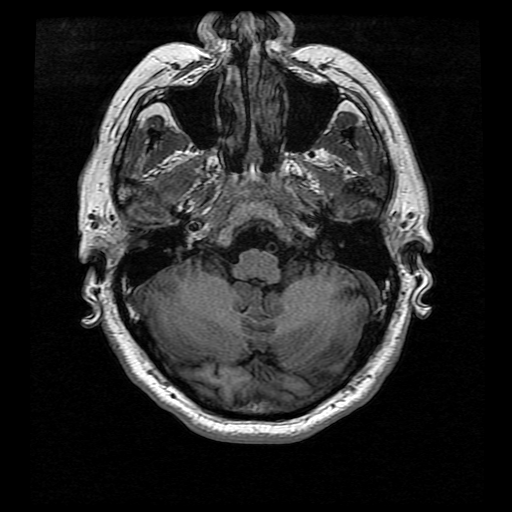
[im 34/100]
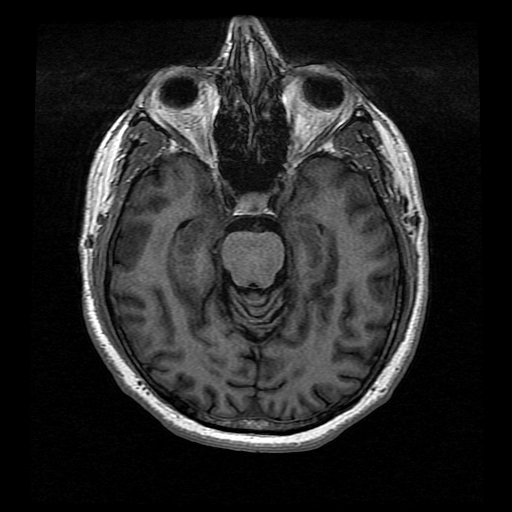
[im 42/100]
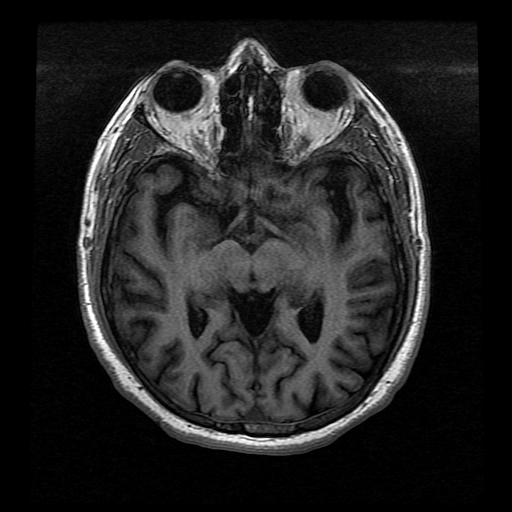
[im 58/100]
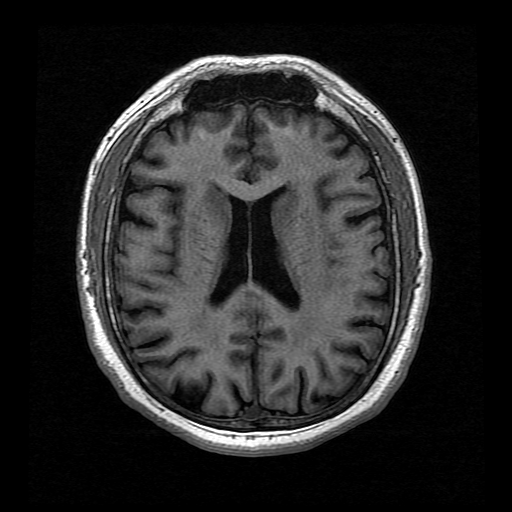
[im 67/100]
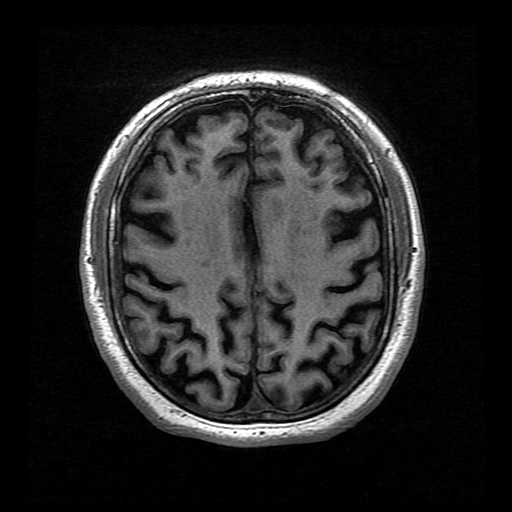
[im 83/100]
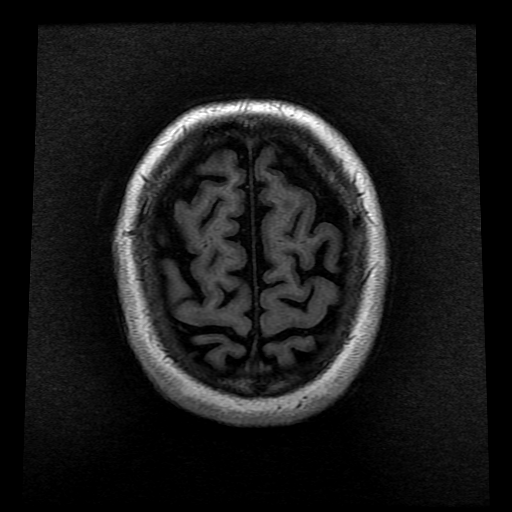
[im 100/100]
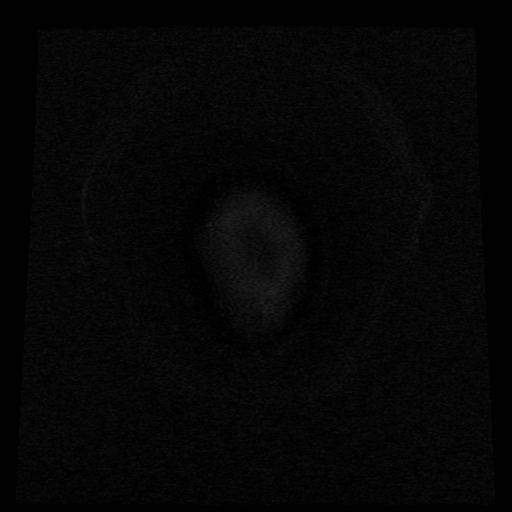

[Series 300: DWI · axial · 3.0mm · 1.09mm/px · z∈[-78,+64]mm · 6 of 50 slices shown (3 of 4)]
[im 1/50]
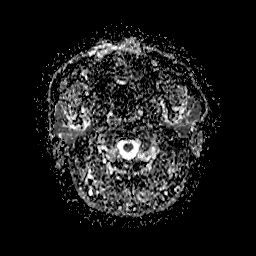
[im 10/50]
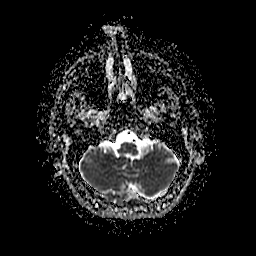
[im 20/50]
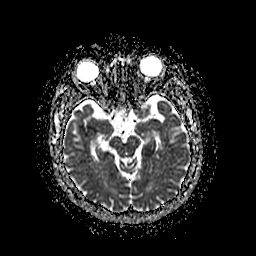
[im 30/50]
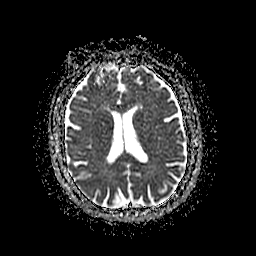
[im 40/50]
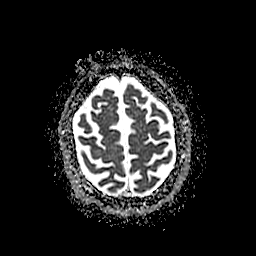
[im 50/50]
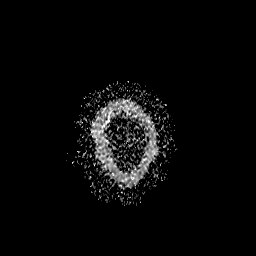

[Series 400: DWI · coronal · 5.0mm · 1.09mm/px · 4 of 33 slices shown (4 of 4)]
[im 1/33]
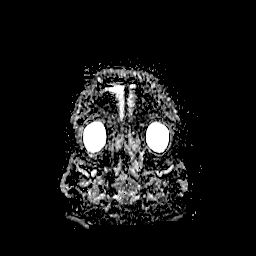
[im 11/33]
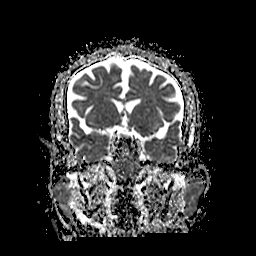
[im 22/33]
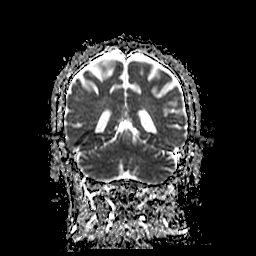
[im 33/33]
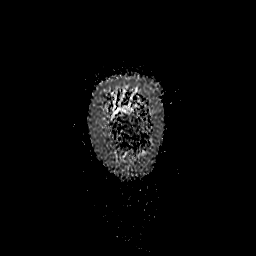

[39 of 48 positions shown; findings below may reference images not displayed]

FINDINGS: Brain: Small area abnormal diffusion restriction in the left
thalamus and medial left temporal lobe. Punctate foci of abnormal
diffusion restriction in the left occipital lobe. No acute or
chronic hemorrhage.

Skull and upper cervical spine: Normal calvarium and skull base.
Visualized upper cervical spine and soft tissues are normal.

Sinuses/Orbits:No paranasal sinus fluid levels or advanced mucosal
thickening. No mastoid or middle ear effusion. Normal orbits.
IMPRESSION: There are a few new punctate foci of acute ischemia in the left PCA
territory, in addition to the unchanged left thalamic and medial
temporal lobe infarcts

## 2020-06-08 MED ORDER — LORAZEPAM 1 MG PO TABS
1.0000 mg | ORAL_TABLET | ORAL | Status: AC | PRN
  Administered 2020-06-08: 1 mg via ORAL
  Filled 2020-06-08: qty 1

## 2020-06-08 MED ORDER — PANTOPRAZOLE SODIUM 40 MG PO TBEC
40.0000 mg | DELAYED_RELEASE_TABLET | Freq: Every day | ORAL | Status: DC
  Administered 2020-06-08 – 2020-06-09 (×2): 40 mg via ORAL
  Filled 2020-06-08 (×3): qty 1

## 2020-06-08 MED ORDER — ATORVASTATIN CALCIUM 40 MG PO TABS
40.0000 mg | ORAL_TABLET | Freq: Every day | ORAL | Status: DC
  Administered 2020-06-08 – 2020-06-12 (×5): 40 mg via ORAL
  Filled 2020-06-08 (×6): qty 1

## 2020-06-08 MED ORDER — NICOTINE 14 MG/24HR TD PT24
14.0000 mg | MEDICATED_PATCH | Freq: Every day | TRANSDERMAL | Status: DC
  Administered 2020-06-08 – 2020-06-12 (×5): 14 mg via TRANSDERMAL
  Filled 2020-06-08 (×5): qty 1

## 2020-06-08 MED ORDER — ASPIRIN EC 325 MG PO TBEC
325.0000 mg | DELAYED_RELEASE_TABLET | Freq: Every day | ORAL | Status: DC
  Administered 2020-06-08 – 2020-06-09 (×2): 325 mg via ORAL
  Filled 2020-06-08 (×2): qty 1

## 2020-06-08 MED ORDER — THIAMINE HCL 100 MG PO TABS
100.0000 mg | ORAL_TABLET | Freq: Every day | ORAL | Status: DC
  Administered 2020-06-08 – 2020-06-12 (×5): 100 mg via ORAL
  Filled 2020-06-08 (×7): qty 1

## 2020-06-08 MED ORDER — METOPROLOL TARTRATE 25 MG PO TABS
12.5000 mg | ORAL_TABLET | Freq: Four times a day (QID) | ORAL | Status: DC
  Administered 2020-06-08 – 2020-06-09 (×3): 12.5 mg via ORAL
  Filled 2020-06-08 (×2): qty 1

## 2020-06-08 MED ORDER — DILTIAZEM HCL 90 MG PO TABS
90.0000 mg | ORAL_TABLET | Freq: Four times a day (QID) | ORAL | Status: DC
  Administered 2020-06-08: 90 mg via ORAL
  Filled 2020-06-08 (×2): qty 1

## 2020-06-08 MED ORDER — LABETALOL HCL 5 MG/ML IV SOLN
5.0000 mg | INTRAVENOUS | Status: DC | PRN
  Administered 2020-06-09: 10 mg via INTRAVENOUS
  Filled 2020-06-08: qty 4

## 2020-06-08 MED ORDER — DILTIAZEM HCL 60 MG PO TABS
60.0000 mg | ORAL_TABLET | Freq: Four times a day (QID) | ORAL | Status: DC
  Filled 2020-06-08: qty 1

## 2020-06-08 MED ORDER — LORAZEPAM 2 MG/ML IJ SOLN
1.0000 mg | INTRAMUSCULAR | Status: AC | PRN
Start: 2020-06-08 — End: 2020-06-11

## 2020-06-08 MED ORDER — FOLIC ACID 1 MG PO TABS
1.0000 mg | ORAL_TABLET | Freq: Every day | ORAL | Status: DC
  Administered 2020-06-08 – 2020-06-12 (×5): 1 mg via ORAL
  Filled 2020-06-08 (×6): qty 1

## 2020-06-08 MED ORDER — ADULT MULTIVITAMIN W/MINERALS CH
1.0000 | ORAL_TABLET | Freq: Every day | ORAL | Status: DC
  Administered 2020-06-08 – 2020-06-12 (×5): 1 via ORAL
  Filled 2020-06-08 (×6): qty 1

## 2020-06-08 NOTE — Progress Notes (Signed)
Pt belongings upon arriving to floor include:  Two yellow chain necklaces 1 gray bracelet A pair of earrings 3 rings  Cell phone [employer] [facility] [facility]

## 2020-06-08 NOTE — Progress Notes (Signed)
  Echocardiogram 2D Echocardiogram has been performed.  Tye Savoy 06/08/2020, 11:57 AM

## 2020-06-08 NOTE — Plan of Care (Signed)
  Problem: Education: Goal: Knowledge of General Education information will improve Description Including pain rating scale, medication(s)/side effects and non-pharmacologic comfort measures Outcome: Progressing   Problem: Clinical Measurements: Goal: Will remain free from infection Outcome: Progressing   Problem: Activity: Goal: Risk for activity intolerance will decrease Outcome: Progressing   Problem: Nutrition: Goal: Adequate nutrition will be maintained Outcome: Progressing   Problem: Safety: Goal: Ability to remain free from injury will improve Outcome: Progressing   

## 2020-06-08 NOTE — Progress Notes (Signed)
NAME:  Jason Gibbs, MRN:  [id number], DOB:  04-26-1975, LOS: 1 ADMISSION DATE:  06/07/2020, CONSULTATION DATE:  06/08/20 REFERRING MD:  Iver Nestle, CHIEF COMPLAINT:   R-sided weakness  History of Present Illness:  45 y.o. M with PMH tobacco and alcohol use who was at work when he developed R facial droop, R hand weakness and gait difficulty. Last known well 1600 on 4/18.  He was taken to the ED as a code stroke and received TPA.  Initial head CT was without acute findings.  Labs with slightly elevated LFT's  and Na 133.    He was evaluated and admitted by neurology.   CTA was obtained with distal L P2 occlusion and cardiac rhythm.   He was in atrial fibrillation with RVR during ED course and was started on Cardizem gtt.  He had no hypotension or electrolyte abnormalities.  He remained in RVR despite maximum Cardizem gtt, so PCCM was consulted.  Pt is awake and alert, states that he has intermittently had palpitations for years and sometimes has exertional shortness of breath.  He denies current palpitations or dyspnea and is stable on RA.  Denies chest pain  Pertinent  Medical History  Tobacco and alcohol use  Significant Hospital Events: Including procedures, antibiotic start and stop dates in addition to other pertinent events   . 4/18 Presented to ED, given TPA, PCCM consult for afib  Interim History / Subjective:  Patient heart rate is better controlled with diltiazem infusion. Denies palpitation or chest pain  Objective   Blood pressure 114/87, pulse 74, temperature 98.5 F (36.9 C), temperature source Oral, resp. rate 18, weight 91 kg, SpO2 93 %.        Intake/Output Summary (Last 24 hours) at 06/08/2020 2831 Last data filed at 06/08/2020 0700 Gross per 24 hour  Intake 300.54 ml  Output 1100 ml  Net -799.46 ml   Filed Weights   06/07/20 1700  Weight: 91 kg    General:  Well-nourished, middle aged M, lying in the bed HEENT: MM pink/moist, no facial droop, pupils equal, no  JVD Neuro: awake, alert, conversational without facial droop, dysarthria or confusion, antigravity in all 4 extremities CV: Irregularly irregular, no murmur or gallop PULM:  Clear bilaterally, no wheezes GI: soft, nondistended, nontender bsx4 active, Extremities: warm/dry, no edema  Skin: no rashes or lesions   Labs/imaging that I havepersonally reviewed  (right click and "Reselect all SmartList Selections" daily)  MRI brain: Small acute/subacute infarcts of the left thalamus and medial temporal lobe, both within the left PCA territory. No hemorrhage or mass effect.  Resolved Hospital Problem list     Assessment & Plan:   Paroxysmal Afib with RVR Patient presented with rapid reticular response in A. 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 Continue secondary stroke prophylaxis MRI confirmed left mesial temporal lobe and left thalamic stroke Management per stroke team  Alcohol abuse Tobacco dependence Patient's MRI showed a probably Started on thiamine and folate Watch for signs of withdrawal Started on nicotine patch  Best practice (right click and "Reselect all SmartList Selections" daily)  Per primary Code Status:  full code Disposition: ICU  Labs   CBC: Recent Labs  Lab 06/07/20 1747 06/08/20 0330  WBC 5.6 6.7  NEUTROABS 3.0  --   HGB 16.1 17.5*  HCT 47.7 50.9  MCV 89.3 87.9  PLT 142* 153    Basic Metabolic Panel: Recent Labs  Lab 06/07/20 1900 06/08/20 0330  NA 133* 135  K 3.7 4.0  CL 100 103  CO2 20* 21*  GLUCOSE  125* 115*  BUN 9 5*  CREATININE 0.68 0.68  CALCIUM 8.4* 8.9  MG 2.1  --    GFR: CrCl cannot be calculated (Unknown ideal weight.). Recent Labs  Lab 06/07/20 1747 06/08/20 0330  WBC 5.6 6.7    Liver Function Tests: Recent Labs  Lab 06/07/20 1900  AST 69*  ALT 59*  ALKPHOS 59  BILITOT 0.7  PROT 7.0  ALBUMIN 3.9   No results for input(s): LIPASE, AMYLASE in the last 168 hours. No results for input(s): AMMONIA in the last 168 hours.  ABG No results found for: PHART, PCO2ART, PO2ART, HCO3, TCO2, ACIDBASEDEF, O2SAT   Coagulation Profile: Recent Labs  Lab 06/07/20 1900  INR 1.2    Cardiac Enzymes: No results for input(s): CKTOTAL, CKMB, CKMBINDEX, TROPONINI in the last 168 hours.  HbA1C: Hgb A1c MFr Bld  Date/Time Value Ref Range Status  06/08/2020 03:30 AM 5.6 4.8 - 5.6 % Final    Comment:    (NOTE) Pre diabetes:          5.7%-6.4%  Diabetes:              >6.4%  Glycemic control for   <7.0% adults with diabetes     CBG: Recent Labs  Lab 06/07/20 1748  GLUCAP 126*      Critical care time:  37 minutes   Total critical care time: 37 minutes  Performed by: Cheri Fowler   Critical care time was exclusive of separately billable procedures and treating other patients.   Critical care was necessary to treat or prevent imminent or life-threatening deterioration.   Critical care was time spent personally by me on the following activities: development of treatment plan with patient and/or surrogate as well as nursing, discussions with consultants, evaluation of patient's response to treatment, examination of patient, obtaining history from patient or surrogate, ordering and performing treatments and interventions, ordering and review of laboratory studies, ordering and review of radiographic studies, pulse oximetry and re-evaluation of patient's condition.   Cheri Fowler MD [facility] See Amion for pager If no response to pager,  please call [phone] until 7pm After 7pm, Please call E-link [phone]

## 2020-06-08 NOTE — Consult Note (Addendum)
Cardiology Consultation:   Patient ID: Jason Gibbs MRN: [id number]; DOB: [birth year]  Admit date: 06/07/2020 Date of Consult: 06/08/2020  PCP:  [facility]  Cardiologist:  New to Dr. Antoine Poche  Advanced Practice Provider:  No care team member to display Electrophysiologist:  None     Patient Profile:   Jason Gibbs is a 45 y.o. male with no known PMH, ETOH abuse, tobacco abuse, who presented with acute right sided weakness and paresthesia, difficulty ambulation on 06/07/20. He was given TPA. Cardiology is consulted for A fib with RVR.   History of Present Illness:   Jason Gibbs has not seeked any medical care for years. He drinks ETOH regularly, average 5-6 beers daily, he endorses increased nervousness when he stopped drinking for 2 to 3 days , but is able to remain off alcohol for 2 to 3 months if wishes. He smokes average 4 to 5 cigarettes daily.  He uses marijuana occasionally. He presented to ER on 06/07/20 afternoon for sudden onset of right facial droop, slurred speech, right arm and leg weakness, and right sided paresthesia. CODE stroke was called, NIHSS 5, and he was given TPA on 18:10 on 06/07/20 by neurology team.   Diagnostic workup showed grossly unremarkable CBC diff, CMP with mild transaminitis and hyponatremia, INR 1.2, Flu and COVID negative. Alcohol level elevated 71. CT code stroke with no acute ICH, ASPECTS is 10. CTA head and neck showed distal left P2 segment occlusion. MRI of brain showed small acute/subacute infarcts of the left thalamus and medial temporal lobe, both within the left PCA territory. No hemorrhage or mass effect. TSH WNL. A1C 5.6%. HDL 44/LDL149. EKG with Coarse Atrial Fib  with RVR with rate of 159 bpm. Echo showed EF 30-35%, LV global hypokinesis, diastolic function can't evaluated, RV systolic function moderately reduced, RV mildly enlarged, LA moderate enlarged, mild to moderate MR. He is admitted to neuro ICU  currently, started on IV diltiazem gtt and transitioned to PO Cardizem 90mg  Q6H for rate control. Cardiology is consulted for A fib and acute systolic heart failure.   During encounter, professional interpreter was used as patient speaks primarily Spanish [phone]), patient states that he is feeling overall improved, reports resolved right-sided weakness and numbness.  He is able to ambulate independently with physical therapy supervision.  He reports heart palpitation yesterday when he came to the ER which has resolved currently. He states he had some SOB and feeling heart beating irregularly intermittently over the past year.  He denies ever having any chest pain or pressure,  orthopnea, PND, weight gain, leg swelling at regular baseline.  He denies any significant personal or family history of cardiac disease.  He denies taking any prescribed medication previously. He lives with a friend and works as a (#[id number].    Home Medications:  Prior to Admission medications   Not on File    Inpatient Medications: Scheduled Meds: . Chlorhexidine Gluconate Cloth  6 each Topical Daily  . folic acid  1 mg Oral Daily  . metoprolol tartrate  12.5 mg Oral Q6H  . multivitamin with minerals  1 tablet Oral Daily  . nicotine  14 mg Transdermal Daily  . pantoprazole  40 mg Oral Daily  . thiamine  100 mg Oral Daily   Continuous Infusions:  PRN Meds: acetaminophen **OR** acetaminophen (TYLENOL) oral liquid 160 mg/5 mL **OR** acetaminophen, labetalol, LORazepam **OR** LORazepam, senna-docusate  Allergies:   No Known Allergies  Social History:  Social History   Socioeconomic History  . Marital status: Unknown    Spouse name: Not on file  . Number of children: Not on file  . Years of education: Not on file  . Highest education level: Not on file  Occupational History  . Not on file  Tobacco Use  . Smoking status: Current Every Day Smoker    Types: Cigarettes  . Smokeless tobacco: Not on file   . Tobacco comment: 3 a day   Substance and Sexual Activity  . Alcohol use: Yes    Alcohol/week: 6.0 standard drinks    Types: 6 Cans of beer per week    Comment: 6 beers daily   . Drug use: Yes    Types: Marijuana    Comment: once in awhile   . Sexual activity: Not on file  Other Topics Concern  . Not on file  Social History Narrative  . Not on file   Social Determinants of Health   Financial Resource Strain: Not on file  Food Insecurity: Not on file  Transportation Needs: Not on file  Physical Activity: Not on file  Stress: Not on file  Social Connections: Not on file  Intimate Partner Violence: Not on file    Family History:   Patient denied any significant family history of cardiac disease  ROS:   Constitutional: Denied fever, chills, malaise, night sweats Eyes: Denied vision change or loss Ears/Nose/Mouth/Throat: Denied ear ache, sore throat, coughing, sinus pain Cardiovascular: see HPI  Respiratory: denied shortness of breath Gastrointestinal: Denied nausea, vomiting, abdominal pain, diarrhea Genital/Urinary: Denied dysuria, hematuria, urinary frequency/urgency Musculoskeletal: see HPI  Skin: Denied rash, wound Neuro: see HPI  Psych: Denied history of depression/anxiety  Endocrine: Denied history of diabetes    Physical Exam/Data:   Vitals:   06/08/20 1100 06/08/20 1200 06/08/20 1215 06/08/20 1300  BP: (!) 133/96 (!) 163/88 (!) 152/96 (!) 134/98  Pulse:      Resp: 17 (!) 24  16  Temp:  98.5 F (36.9 C)    TempSrc:  Oral    SpO2:      Weight:        Intake/Output Summary (Last 24 hours) at 06/08/2020 1557 Last data filed at 06/08/2020 1300 Gross per 24 hour  Intake 588.9 ml  Output 1600 ml  Net -1011.1 ml   Last 3 Weights 06/07/2020  Weight (lbs) 200 lb 9.9 oz  Weight (kg) 91 kg     There is no height or weight on file to calculate BMI.   Vitals:  Vitals:   06/08/20 1215 06/08/20 1300  BP: (!) 152/96 (!) 134/98  Pulse:    Resp:  16   Temp:    SpO2:     General Appearance: In no apparent distress, sitting in chair, ambulate independently with steady gait HEENT: Normocephalic, atraumatic. EOMs intact.  Neck: Supple, trachea midline, no JVDs Cardiovascular: Irregularly irregular, normal S1-S2,  no murmur/rub/gallop Respiratory: Resting breathing unlabored, lungs sounds clear to auscultation bilaterally, no use of accessory muscles. On room air.  No wheezes, rales or rhonchi.   Gastrointestinal: Bowel sounds positive, abdomen soft, non-tender, non-distended. No mass or organomegaly.  Extremities: Able to move all extremities without difficulty, no bilateral lower extremity edema Genitourinary:  genital exam not performed Musculoskeletal: Normal muscle bulk and tone, muscle strength 5/5 throughout, no limited range of motion, no swollen or erythematous joints Skin: Intact, warm, dry. No rashes or petechiae noted in exposed areas.  Neurologic: Alert, oriented to person, place and  time. Fluent speech, no cognitive deficit, no pronator drift, no gross sensory deficit, no focal muscle weakness Psychiatric: Normal affect. Mood is appropriate.    EKG:  The EKG was personally reviewed and demonstrates:  A fib with RVR 159 bpm  Telemetry:  Telemetry was personally reviewed and demonstrates:  A fib with RVR with heart rate 90s at rest, 120s with ambulation  Relevant CV Studies:  Echo on 06/08/20:  1. Left ventricular ejection fraction, by estimation, is 30 to 35%. The  left ventricle has moderately decreased function. The left ventricle  demonstrates global hypokinesis. Left ventricular diastolic function could  not be evaluated.  2. Right ventricular systolic function is moderately reduced. The right  ventricular size is mildly enlarged. Tricuspid regurgitation signal is  inadequate for assessing PA pressure.  3. Left atrial size was moderately dilated.  4. The mitral valve is normal in structure. Mild to moderate mitral  valve  regurgitation.  5. The aortic valve is normal in structure. Aortic valve regurgitation is  not visualized.  6. The inferior vena cava is dilated in size with >50% respiratory  variability, suggesting right atrial pressure of 8 mmHg.    Laboratory Data:  High Sensitivity Troponin:  No results for input(s): TROPONINIHS in the last 720 hours.   Chemistry Recent Labs  Lab 06/07/20 1900 06/08/20 0330  NA 133* 135  K 3.7 4.0  CL 100 103  CO2 20* 21*  GLUCOSE 125* 115*  BUN 9 5*  CREATININE 0.68 0.68  CALCIUM 8.4* 8.9  GFRNONAA >60 >60  ANIONGAP 13 11    Recent Labs  Lab 06/07/20 1900  PROT 7.0  ALBUMIN 3.9  AST 69*  ALT 59*  ALKPHOS 59  BILITOT 0.7   Hematology Recent Labs  Lab 06/07/20 1747 06/08/20 0330 06/08/20 1246  WBC 5.6 6.7 6.1  RBC 5.34 5.79 5.89*  HGB 16.1 17.5* 17.7*  HCT 47.7 50.9 51.8  MCV 89.3 87.9 87.9  MCH 30.1 30.2 30.1  MCHC 33.8 34.4 34.2  RDW 12.9 12.8 12.8  PLT 142* 153 159   BNPNo results for input(s): BNP, PROBNP in the last 168 hours.  DDimer No results for input(s): DDIMER in the last 168 hours.   Radiology/Studies:  CT ANGIO HEAD W OR WO CONTRAST  Result Date: 06/07/2020 CLINICAL DATA:  Stroke/TIA, assess extracranial arteries EXAM: CT ANGIOGRAPHY HEAD AND NECK TECHNIQUE: Multidetector CT imaging of the head and neck was performed using the standard protocol during bolus administration of intravenous contrast. Multiplanar CT image reconstructions and MIPs were obtained to evaluate the vascular anatomy. Carotid stenosis measurements (when applicable) are obtained utilizing NASCET criteria, using the distal internal carotid diameter as the denominator. CONTRAST:  75mL OMNIPAQUE IOHEXOL 350 MG/ML SOLN COMPARISON:  None. FINDINGS: CT HEAD FINDINGS Brain: There is no mass, hemorrhage or extra-axial collection. The size and configuration of the ventricles and extra-axial CSF spaces are normal. There is no acute or chronic infarction.  The brain parenchyma is normal. Skull: The visualized skull base, calvarium and extracranial soft tissues are normal. Sinuses/Orbits: No fluid levels or advanced mucosal thickening of the visualized paranasal sinuses. No mastoid or middle ear effusion. The orbits are normal. CTA NECK FINDINGS SKELETON: There is no bony spinal canal stenosis. No lytic or blastic lesion. OTHER NECK: Normal pharynx, larynx and major salivary glands. No cervical lymphadenopathy. Unremarkable thyroid gland. UPPER CHEST: No pneumothorax or pleural effusion. No nodules or masses. AORTIC ARCH: There is no calcific atherosclerosis of the aortic arch. There  is no aneurysm, dissection or hemodynamically significant stenosis of the visualized portion of the aorta. Conventional 3 vessel aortic branching pattern. The visualized proximal subclavian arteries are widely patent. RIGHT CAROTID SYSTEM: Normal without aneurysm, dissection or stenosis. LEFT CAROTID SYSTEM: Normal without aneurysm, dissection or stenosis. VERTEBRAL ARTERIES: Left dominant configuration. Both origins are clearly patent. There is no dissection, occlusion or flow-limiting stenosis to the skull base (V1-V3 segments). CTA HEAD FINDINGS POSTERIOR CIRCULATION: --Vertebral arteries: Normal V4 segments. --Inferior cerebellar arteries: Normal. --Basilar artery: Normal. --Superior cerebellar arteries: Normal. --Posterior cerebral arteries (PCA): Distal left P2 segment occlusion (14:23). Normal right. ANTERIOR CIRCULATION: --Intracranial internal carotid arteries: Normal. --Anterior cerebral arteries (ACA): Normal. Both A1 segments are present. Patent anterior communicating artery (a-comm). --Middle cerebral arteries (MCA): Normal. VENOUS SINUSES: As permitted by contrast timing, patent. ANATOMIC VARIANTS: None Review of the MIP images confirms the above findings. IMPRESSION: 1. Distal left P2 segment occlusion. 2. No other intracranial arterial occlusion or high-grade stenosis. 3.  Normal cervical carotid and vertebral arteries. Electronically Signed   By: Deatra Robinson M.D.   On: 06/07/2020 21:22   CT ANGIO NECK W OR WO CONTRAST  Result Date: 06/07/2020 CLINICAL DATA:  Stroke/TIA, assess extracranial arteries EXAM: CT ANGIOGRAPHY HEAD AND NECK TECHNIQUE: Multidetector CT imaging of the head and neck was performed using the standard protocol during bolus administration of intravenous contrast. Multiplanar CT image reconstructions and MIPs were obtained to evaluate the vascular anatomy. Carotid stenosis measurements (when applicable) are obtained utilizing NASCET criteria, using the distal internal carotid diameter as the denominator. CONTRAST:  75mL OMNIPAQUE IOHEXOL 350 MG/ML SOLN COMPARISON:  None. FINDINGS: CT HEAD FINDINGS Brain: There is no mass, hemorrhage or extra-axial collection. The size and configuration of the ventricles and extra-axial CSF spaces are normal. There is no acute or chronic infarction. The brain parenchyma is normal. Skull: The visualized skull base, calvarium and extracranial soft tissues are normal. Sinuses/Orbits: No fluid levels or advanced mucosal thickening of the visualized paranasal sinuses. No mastoid or middle ear effusion. The orbits are normal. CTA NECK FINDINGS SKELETON: There is no bony spinal canal stenosis. No lytic or blastic lesion. OTHER NECK: Normal pharynx, larynx and major salivary glands. No cervical lymphadenopathy. Unremarkable thyroid gland. UPPER CHEST: No pneumothorax or pleural effusion. No nodules or masses. AORTIC ARCH: There is no calcific atherosclerosis of the aortic arch. There is no aneurysm, dissection or hemodynamically significant stenosis of the visualized portion of the aorta. Conventional 3 vessel aortic branching pattern. The visualized proximal subclavian arteries are widely patent. RIGHT CAROTID SYSTEM: Normal without aneurysm, dissection or stenosis. LEFT CAROTID SYSTEM: Normal without aneurysm, dissection or stenosis.  VERTEBRAL ARTERIES: Left dominant configuration. Both origins are clearly patent. There is no dissection, occlusion or flow-limiting stenosis to the skull base (V1-V3 segments). CTA HEAD FINDINGS POSTERIOR CIRCULATION: --Vertebral arteries: Normal V4 segments. --Inferior cerebellar arteries: Normal. --Basilar artery: Normal. --Superior cerebellar arteries: Normal. --Posterior cerebral arteries (PCA): Distal left P2 segment occlusion (14:23). Normal right. ANTERIOR CIRCULATION: --Intracranial internal carotid arteries: Normal. --Anterior cerebral arteries (ACA): Normal. Both A1 segments are present. Patent anterior communicating artery (a-comm). --Middle cerebral arteries (MCA): Normal. VENOUS SINUSES: As permitted by contrast timing, patent. ANATOMIC VARIANTS: None Review of the MIP images confirms the above findings. IMPRESSION: 1. Distal left P2 segment occlusion. 2. No other intracranial arterial occlusion or high-grade stenosis. 3. Normal cervical carotid and vertebral arteries. Electronically Signed   By: Deatra Robinson M.D.   On: 06/07/2020 21:22   MR BRAIN WO  CONTRAST  Result Date: 06/07/2020 CLINICAL DATA:  Right-sided weakness EXAM: MRI HEAD WITHOUT CONTRAST TECHNIQUE: Multiplanar, multiecho pulse sequences of the brain and surrounding structures were obtained without intravenous contrast. COMPARISON:  CTA head neck 06/07/2020 FINDINGS: Brain: There are areas of abnormal diffusion restriction in the left thalamus and medial temporal lobe. No acute or chronic hemorrhage. There is multifocal hyperintense T2-weighted signal within the white matter. Parenchymal volume and CSF spaces are normal. The midline structures are normal. Vascular: Major flow voids are preserved. Skull and upper cervical spine: Normal calvarium and skull base. Visualized upper cervical spine and soft tissues are normal. Sinuses/Orbits:No paranasal sinus fluid levels or advanced mucosal thickening. No mastoid or middle ear effusion.  Normal orbits. IMPRESSION: Small acute/subacute infarcts of the left thalamus and medial temporal lobe, both within the left PCA territory. No hemorrhage or mass effect. Electronically Signed   By: Deatra Robinson M.D.   On: 06/07/2020 22:01   ECHOCARDIOGRAM COMPLETE  Result Date: 06/08/2020    ECHOCARDIOGRAM REPORT   Patient Name:   Jason Gibbs Date of Exam: 06/08/2020 Medical Rec #:  [id number]     Height:       72.0 in Accession #:    [id number]    Weight:       200.6 lb Date of Birth:  May 24, 1975    BSA:          2.133 m Patient Age:    44 years      BP:           114/87 mmHg Patient Gender: M             HR:           95 bpm. Exam Location:  Inpatient Procedure: 2D Echo Indications:    Stroke; Atrial Fibrillation I48.91  History:        Patient has no prior history of Echocardiogram examinations.  Sonographer:    Thurman Coyer RDCS (AE) Referring Phys: [id number] SRISHTI L BHAGAT IMPRESSIONS  1. Left ventricular ejection fraction, by estimation, is 30 to 35%. The left ventricle has moderately decreased function. The left ventricle demonstrates global hypokinesis. Left ventricular diastolic function could not be evaluated.  2. Right ventricular systolic function is moderately reduced. The right ventricular size is mildly enlarged. Tricuspid regurgitation signal is inadequate for assessing PA pressure.  3. Left atrial size was moderately dilated.  4. The mitral valve is normal in structure. Mild to moderate mitral valve regurgitation.  5. The aortic valve is normal in structure. Aortic valve regurgitation is not visualized.  6. The inferior vena cava is dilated in size with >50% respiratory variability, suggesting right atrial pressure of 8 mmHg. FINDINGS  Left Ventricle: Left ventricular ejection fraction, by estimation, is 30 to 35%. The left ventricle has moderately decreased function. The left ventricle demonstrates global hypokinesis. The left ventricular internal cavity size was normal in size. There is  no left ventricular hypertrophy. Left ventricular diastolic function could not be evaluated due to atrial fibrillation. Left ventricular diastolic function could not be evaluated. Right Ventricle: The right ventricular size is mildly enlarged. No increase in right ventricular wall thickness. Right ventricular systolic function is moderately reduced. Tricuspid regurgitation signal is inadequate for assessing PA pressure. Left Atrium: Left atrial size was moderately dilated. Right Atrium: Right atrial size was normal in size. Pericardium: There is no evidence of pericardial effusion. Mitral Valve: The mitral valve is normal in structure. Mild to moderate mitral valve regurgitation, with centrally-directed jet. Tricuspid  Valve: The tricuspid valve is normal in structure. Tricuspid valve regurgitation is not demonstrated. Aortic Valve: The aortic valve is normal in structure. Aortic valve regurgitation is not visualized. Pulmonic Valve: The pulmonic valve was grossly normal. Pulmonic valve regurgitation is not visualized. Aorta: The aortic root and ascending aorta are structurally normal, with no evidence of dilitation. Venous: The inferior vena cava is dilated in size with greater than 50% respiratory variability, suggesting right atrial pressure of 8 mmHg. IAS/Shunts: No atrial level shunt detected by color flow Doppler.  LEFT VENTRICLE PLAX 2D LVIDd:         5.00 cm LVIDs:         4.10 cm LV PW:         1.00 cm LV IVS:        1.00 cm LVOT diam:     2.10 cm LV SV:         52 LV SV Index:   24 LVOT Area:     3.46 cm  RIGHT VENTRICLE RV Basal diam:  4.40 cm RV S prime:     7.72 cm/s TAPSE (M-mode): 1.1 cm LEFT ATRIUM           Index       RIGHT ATRIUM           Index LA diam:      4.50 cm 2.11 cm/m  RA Area:     16.00 cm LA Vol (A2C): 60.3 ml 28.27 ml/m RA Volume:   41.20 ml  19.31 ml/m LA Vol (A4C): 60.4 ml 28.33 ml/m  AORTIC VALVE LVOT Vmax:   93.70 cm/s LVOT Vmean:  61.067 cm/s LVOT VTI:    0.149 m  AORTA Ao  Root diam: 3.50 cm MR Peak grad: 77.4 mmHg MR Mean grad: 55.0 mmHg   SHUNTS MR Vmax:      440.00 cm/s Systemic VTI:  0.15 m MR Vmean:     357.0 cm/s  Systemic Diam: 2.10 cm Rachelle Hora Croitoru MD Electronically signed by Thurmon Fair MD Signature Date/Time: 06/08/2020/12:42:02 PM    Final    CT HEAD CODE STROKE WO CONTRAST  Result Date: 06/07/2020 CLINICAL DATA:  Code stroke. Acute neuro deficit. Slurred speech right facial droop EXAM: CT HEAD WITHOUT CONTRAST TECHNIQUE: Contiguous axial images were obtained from the base of the skull through the vertex without intravenous contrast. COMPARISON:  None. FINDINGS: Brain: No evidence of acute infarction, hemorrhage, hydrocephalus, extra-axial collection or mass lesion/mass effect. Vascular: Atherosclerotic calcification distal vertebral arteries bilaterally. Negative for hyperdense vessel Skull: Negative Sinuses/Orbits: Negative Other: None ASPECTS (Alberta Stroke Program Early CT Score) - Ganglionic level infarction (caudate, lentiform nuclei, internal capsule, insula, M1-M3 cortex): 7 - Supraganglionic infarction (M4-M6 cortex): 3 Total score (0-10 with 10 being normal): 10 IMPRESSION: 1. No acute intracranial abnormality. 2. ASPECTS is 10 3. Code stroke imaging results were communicated on 06/07/2020 at 6:03 pm to provider Bhagat via text page Electronically Signed   By: Marlan Palau M.D.   On: 06/07/2020 18:04     Assessment and Plan:   New onset of A fib with RVR  - unknown onset and duration  - rate is controlled with PO diltiazem currently, noted HR 90-100s at rest and 120s with ambulation today  - will repeat EKG today  - recommend discontinue Cardizem, transitioned to PO metoprolol 12.5mg  q6h for rate control, up-titrate as needed, transiton to XL at DC (if neuro ok from permissive HTN standpoint) - UDS negative for cocaine, patient denied illicit  drug use  - CHA2DS2VASc score is 3 due to CHF, CVA. 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, ETOH induced cardiomyopathy, would repeat Echo in 4-6  weeks outpatient, if EF without improvement, left heart catheterization should be considered for ischemic workup  - monitor daily weight, intake and output, recommend low sodium diet  - recommend GDMT with initiation of metoprolol, if BP tolerating and renal function stable, may consider add on Entresto, Jardiance and spironolactone at outpatient follow up  - patient is educated on low sodium diet, CHF signs, weight monitor, and fluid restriction   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 given A fib RVR , s/p TPA on 06/07/20, neurologically recovered  - Recommend anticoagulation for embolic CVA prophylaxis due to underlying atrial fibrillation - management per neuro   Hyperlipidemia - LDL 149, recommend initiate high intensity statin given CVA  Alcohol abuse -Patient drinks 5-6 beers daily, endorsed withdrawal symptoms at home -Monitor for DT, CIWA protocol -Recommend long-term folic acid and thiamine supplement -Discussed alcohol moderation  Smoking -Discussed smoking cessation    Risk Assessment/Risk Scores:  { CHA2DS2-VASc Score = 3  This indicates a 3.2% annual risk of stroke. The patient's score is based upon: CHF History: Yes HTN History: No Diabetes History: No Stroke History: Yes Vascular Disease History: No Age Score: 0 Gender Score: 0     For questions or updates,  please contact [facility] Please consult [email or web address] for contact info under    Signed, Cyndi BenderXika Zhao, NP  06/08/2020 3:57 PM   History and all data above reviewed.  Patient examined.  The history was obtained through a translator.  The patient has no prior cardiac history.  He presented with right-sided weakness.  He is found to have strokes as above and newly diagnosed atrial fibrillation.  He also has reduced ejection fraction with global hypokinesis.  In retrospect he said for about a year he has felt his heart probably beating fast at times.  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  He says he does not get short of breath.  He does not notice chest pressure, neck or arm discomfort.  Does have any PND or orthopnea.  He does drink about 5 beers a night.  I agree with the findings as above.  The patient exam reveals ZOX:[id number]:Irregular, S3,  Lungs: Clear  ,  Abd: Positive bowel sounds normal in frequency and pitch, no bruits, rebound, guarding, Ext no edema.  All available labs, radiology testing, previous records reviewed. Agree with documented assessment and plan.   Cardiomyopathy: Etiology of this is less likely to be ischemic but more likely to be rate related or alcohol mediated.  We will manage this by initiating beta-blockers first.  I will allow a little permissive hypertensive but soon will start Entresto and titrate these meds over time with goal-directed medical therapy.  This will remain complicated by his lack of insurance and social worker should be involved.  I  eventually will follow with a repeat echocardiogram and if he continues to have a reduced ejection fraction with maximal medical therapy and while abstaining from alcohol I will consider an ischemia work-up.  Atrial fibrillation: The patient will need rate control with up titration of the beta-blockers over time.  Needs Xarelto.  If he continues to have atrial fibrillation  after 3 weeks of anticoagulation I will likely pursue cardioversion. Fayrene Fearing Breiona Couvillon  4:21 PM  06/08/2020

## 2020-06-08 NOTE — Evaluation (Signed)
Physical Therapy Evaluation Patient Details Name: Jason Gibbs MRN: [id number] DOB: 05-26-1975 Today's Date: 06/08/2020   History of Present Illness  44 yo male presenting to ED with R-sided weakness. Received tPA on 4/18 @ 1809. MRI showing small acute/subacute infarcts of L thalamus and medial temporal lobe. PMH including daily alcohol use and ongoing smoking.  Clinical Impression  Prior to admission, pt lives with his friends and works as a Airline pilot. Pt denies residual symptoms and no focal deficits noted. Ambulating x 250 feet with no assistive device without physical difficulty. Able to perform high level balance activities I.e. head turns, stops/starts, stepping over objects without instability. Noted mild tremulousness and elevated HR with activity (90-137 afib). Pt noted to be on CIWA precautions. Will follow acutely to assess; no PT follow up anticipated.      Follow Up Recommendations No PT follow up    Equipment Recommendations  None recommended by PT    Recommendations for Other Services       Precautions / Restrictions Precautions Precautions: Other (comment) Precaution Comments: watch HR Restrictions Weight Bearing Restrictions: No      Mobility  Bed Mobility Overal bed mobility: Independent                  Transfers Overall transfer level: Independent Equipment used: None                Ambulation/Gait Ambulation/Gait assistance: Independent Gait Distance (Feet): 250 Feet Assistive device: None Gait Pattern/deviations: WFL(Within Functional Limits)     General Gait Details: No gross instability noted. Able to perform head turns, stops/starts, stepping over obstacles without LOB  Stairs            Wheelchair Mobility    Modified Rankin (Stroke Patients Only) Modified Rankin (Stroke Patients Only) Pre-Morbid Rankin Score: No symptoms Modified Rankin: No significant disability     Balance Overall balance assessment: No apparent  balance deficits (not formally assessed)                                           Pertinent Vitals/Pain Pain Assessment: No/denies pain    Home Living Family/patient expects to be discharged to:: Private residence Living Arrangements: Non-relatives/Friends (3 roommates) Available Help at Discharge: Friend(s);Available PRN/intermittently Type of Home: House Home Access: Level entry     Home Layout: One level Home Equipment: None      Prior Function Level of Independence: Independent         Comments: Waiter at [employer]   Dominant Hand: Right    Extremity/Trunk Assessment   Upper Extremity Assessment Upper Extremity Assessment: Defer to OT evaluation    Lower Extremity Assessment Lower Extremity Assessment: RLE deficits/detail;LLE deficits/detail RLE Deficits / Details: Strength 5/5 LLE Deficits / Details: Strength 5/5    Cervical / Trunk Assessment Cervical / Trunk Assessment: Normal  Communication   Communication: No difficulties  Cognition Arousal/Alertness: Awake/alert Behavior During Therapy: WFL for tasks assessed/performed Overall Cognitive Status: Within Functional Limits for tasks assessed                                        General Comments      Exercises     Assessment/Plan    PT Assessment Patient needs continued  PT services  PT Problem List Cardiopulmonary status limiting activity       PT Treatment Interventions Gait training;Stair training;Functional mobility training;Therapeutic activities;Therapeutic exercise;Balance training;Patient/family education    PT Goals (Current goals can be found in the Care Plan section)  Acute Rehab PT Goals Patient Stated Goal: did not state PT Goal Formulation: With patient Time For Goal Achievement: 06/22/20 Potential to Achieve Goals: Good    Frequency Min 4X/week   Barriers to discharge        Co-evaluation                AM-PAC PT "6 Clicks" Mobility  Outcome Measure Help needed turning from your back to your side while in a flat bed without using bedrails?: None Help needed moving from lying on your back to sitting on the side of a flat bed without using bedrails?: None Help needed moving to and from a bed to a chair (including a wheelchair)?: None Help needed standing up from a chair using your arms (e.g., wheelchair or bedside chair)?: None Help needed to walk in hospital room?: None Help needed climbing 3-5 steps with a railing? : A Little 6 Click Score: 23    End of Session   Activity Tolerance: Patient tolerated treatment well Patient left: in chair;with call bell/phone within reach;with chair alarm set Nurse Communication: Mobility status PT Visit Diagnosis: Other symptoms and signs involving the nervous system (R29.898)    Time: 1401-1420 PT Time Calculation (min) (ACUTE ONLY): 19 min   Charges:   PT Evaluation $PT Eval Moderate Complexity: 1 Mod          Lillia Pauls, PT, DPT Acute Rehabilitation Services Pager [phone] Office [phone]   Norval Morton 06/08/2020, 4:14 PM

## 2020-06-08 NOTE — TOC CAGE-AID Note (Signed)
Transition of Care [facility]) - CAGE-AID Screening   Patient Details  Name: Daxton Nydam MRN: [id number] Date of Birth: [birth year]  [facility]) CM/SW Contact:    Mearl Latin, LCSW Phone Number: 06/08/2020, 12:40 PM   Clinical Narrative: CSW spoke with patient and completed screening. He reported that he does drink a few beers everyday and uses marijuana one or two times per month but does not feel it is an issue for him and declined needing any resources. He stays with friends and is agreeable to a PCP appointment at [facility]; info placed on AVS.   CAGE-AID Screening:    Have You Ever Felt You Ought to Cut Down on Your Drinking or Drug Use?: No Have People Annoyed You By [employer] Your Drinking Or Drug Use?: No Have You Felt Bad Or Guilty About Your Drinking Or Drug Use?: No Have You Ever Had a Drink or Used Drugs First Thing In The Morning to Steady Your Nerves or to Get Rid of a Hangover?: No CAGE-AID Score: 0  Substance Abuse Education Offered: Yes  Substance abuse interventions: Patient Counseling

## 2020-06-08 NOTE — Progress Notes (Signed)
STROKE TEAM PROGRESS NOTE   SUBJECTIVE (INTERVAL HISTORY) His echo tech is at the bedside.  Overall his condition is stable.  Patient still has A. fib RVR, however no acute distress.  He has no history of A. fib in the past.  Patient currently doing well, PT/OT recommend no PT/OT follow-up.  Pending MRI.   OBJECTIVE Temp:  [98.2 F (36.8 C)-98.8 F (37.1 C)] 98.5 F (36.9 C) (04/19 0800) Pulse Rate:  [43-164] 86 (04/19 1000) Cardiac Rhythm: Atrial fibrillation (04/19 1000) Resp:  [15-25] 18 (04/19 1000) BP: (102-155)/(69-134) 131/86 (04/19 1000) SpO2:  [89 %-98 %] 92 % (04/19 1000) Weight:  [91 kg] 91 kg (04/18 1700)  Recent Labs  Lab 06/07/20 1748  GLUCAP 126*   Recent Labs  Lab 06/07/20 1900 06/08/20 0330  NA 133* 135  K 3.7 4.0  CL 100 103  CO2 20* 21*  GLUCOSE 125* 115*  BUN 9 5*  CREATININE 0.68 0.68  CALCIUM 8.4* 8.9  MG 2.1  --    Recent Labs  Lab 06/07/20 1900  AST 69*  ALT 59*  ALKPHOS 59  BILITOT 0.7  PROT 7.0  ALBUMIN 3.9   Recent Labs  Lab 06/07/20 1747 06/08/20 0330  WBC 5.6 6.7  NEUTROABS 3.0  --   HGB 16.1 17.5*  HCT 47.7 50.9  MCV 89.3 87.9  PLT 142* 153   No results for input(s): CKTOTAL, CKMB, CKMBINDEX, TROPONINI in the last 168 hours. Recent Labs    06/07/20 1900  LABPROT 14.7  INR 1.2   No results for input(s): COLORURINE, LABSPEC, PHURINE, GLUCOSEU, HGBUR, BILIRUBINUR, KETONESUR, PROTEINUR, UROBILINOGEN, NITRITE, LEUKOCYTESUR in the last 72 hours.  Invalid input(s): APPERANCEUR     Component Value Date/Time   CHOL 214 (H) 06/08/2020 0330   TRIG 104 06/08/2020 0330   HDL 44 06/08/2020 0330   CHOLHDL 4.9 06/08/2020 0330   VLDL 21 06/08/2020 0330   LDLCALC 149 (H) 06/08/2020 0330   Lab Results  Component Value Date   HGBA1C 5.6 06/08/2020   No results found for: LABOPIA, COCAINSCRNUR, LABBENZ, AMPHETMU, THCU, LABBARB  Recent Labs  Lab 06/07/20 2227  ETH 71*    I have personally reviewed the radiological images  below and agree with the radiology interpretations.  CT ANGIO HEAD W OR WO CONTRAST  Result Date: 06/07/2020 CLINICAL DATA:  Stroke/TIA, assess extracranial arteries EXAM: CT ANGIOGRAPHY HEAD AND NECK TECHNIQUE: Multidetector CT imaging of the head and neck was performed using the standard protocol during bolus administration of intravenous contrast. Multiplanar CT image reconstructions and MIPs were obtained to evaluate the vascular anatomy. Carotid stenosis measurements (when applicable) are obtained utilizing NASCET criteria, using the distal internal carotid diameter as the denominator. CONTRAST:  75mL OMNIPAQUE IOHEXOL 350 MG/ML SOLN COMPARISON:  None. FINDINGS: CT HEAD FINDINGS Brain: There is no mass, hemorrhage or extra-axial collection. The size and configuration of the ventricles and extra-axial CSF spaces are normal. There is no acute or chronic infarction. The brain parenchyma is normal. Skull: The visualized skull base, calvarium and extracranial soft tissues are normal. Sinuses/Orbits: No fluid levels or advanced mucosal thickening of the visualized paranasal sinuses. No mastoid or middle ear effusion. The orbits are normal. CTA NECK FINDINGS SKELETON: There is no bony spinal canal stenosis. No lytic or blastic lesion. OTHER NECK: Normal pharynx, larynx and major salivary glands. No cervical lymphadenopathy. Unremarkable thyroid gland. UPPER CHEST: No pneumothorax or pleural effusion. No nodules or masses. AORTIC ARCH: There is no calcific atherosclerosis of  the aortic arch. There is no aneurysm, dissection or hemodynamically significant stenosis of the visualized portion of the aorta. Conventional 3 vessel aortic branching pattern. The visualized proximal subclavian arteries are widely patent. RIGHT CAROTID SYSTEM: Normal without aneurysm, dissection or stenosis. LEFT CAROTID SYSTEM: Normal without aneurysm, dissection or stenosis. VERTEBRAL ARTERIES: Left dominant configuration. Both origins are  clearly patent. There is no dissection, occlusion or flow-limiting stenosis to the skull base (V1-V3 segments). CTA HEAD FINDINGS POSTERIOR CIRCULATION: --Vertebral arteries: Normal V4 segments. --Inferior cerebellar arteries: Normal. --Basilar artery: Normal. --Superior cerebellar arteries: Normal. --Posterior cerebral arteries (PCA): Distal left P2 segment occlusion (14:23). Normal right. ANTERIOR CIRCULATION: --Intracranial internal carotid arteries: Normal. --Anterior cerebral arteries (ACA): Normal. Both A1 segments are present. Patent anterior communicating artery (a-comm). --Middle cerebral arteries (MCA): Normal. VENOUS SINUSES: As permitted by contrast timing, patent. ANATOMIC VARIANTS: None Review of the MIP images confirms the above findings. IMPRESSION: 1. Distal left P2 segment occlusion. 2. No other intracranial arterial occlusion or high-grade stenosis. 3. Normal cervical carotid and vertebral arteries. Electronically Signed   By: Deatra Robinson M.D.   On: 06/07/2020 21:22   CT ANGIO NECK W OR WO CONTRAST  Result Date: 06/07/2020 CLINICAL DATA:  Stroke/TIA, assess extracranial arteries EXAM: CT ANGIOGRAPHY HEAD AND NECK TECHNIQUE: Multidetector CT imaging of the head and neck was performed using the standard protocol during bolus administration of intravenous contrast. Multiplanar CT image reconstructions and MIPs were obtained to evaluate the vascular anatomy. Carotid stenosis measurements (when applicable) are obtained utilizing NASCET criteria, using the distal internal carotid diameter as the denominator. CONTRAST:  48mL OMNIPAQUE IOHEXOL 350 MG/ML SOLN COMPARISON:  None. FINDINGS: CT HEAD FINDINGS Brain: There is no mass, hemorrhage or extra-axial collection. The size and configuration of the ventricles and extra-axial CSF spaces are normal. There is no acute or chronic infarction. The brain parenchyma is normal. Skull: The visualized skull base, calvarium and extracranial soft tissues are  normal. Sinuses/Orbits: No fluid levels or advanced mucosal thickening of the visualized paranasal sinuses. No mastoid or middle ear effusion. The orbits are normal. CTA NECK FINDINGS SKELETON: There is no bony spinal canal stenosis. No lytic or blastic lesion. OTHER NECK: Normal pharynx, larynx and major salivary glands. No cervical lymphadenopathy. Unremarkable thyroid gland. UPPER CHEST: No pneumothorax or pleural effusion. No nodules or masses. AORTIC ARCH: There is no calcific atherosclerosis of the aortic arch. There is no aneurysm, dissection or hemodynamically significant stenosis of the visualized portion of the aorta. Conventional 3 vessel aortic branching pattern. The visualized proximal subclavian arteries are widely patent. RIGHT CAROTID SYSTEM: Normal without aneurysm, dissection or stenosis. LEFT CAROTID SYSTEM: Normal without aneurysm, dissection or stenosis. VERTEBRAL ARTERIES: Left dominant configuration. Both origins are clearly patent. There is no dissection, occlusion or flow-limiting stenosis to the skull base (V1-V3 segments). CTA HEAD FINDINGS POSTERIOR CIRCULATION: --Vertebral arteries: Normal V4 segments. --Inferior cerebellar arteries: Normal. --Basilar artery: Normal. --Superior cerebellar arteries: Normal. --Posterior cerebral arteries (PCA): Distal left P2 segment occlusion (14:23). Normal right. ANTERIOR CIRCULATION: --Intracranial internal carotid arteries: Normal. --Anterior cerebral arteries (ACA): Normal. Both A1 segments are present. Patent anterior communicating artery (a-comm). --Middle cerebral arteries (MCA): Normal. VENOUS SINUSES: As permitted by contrast timing, patent. ANATOMIC VARIANTS: None Review of the MIP images confirms the above findings. IMPRESSION: 1. Distal left P2 segment occlusion. 2. No other intracranial arterial occlusion or high-grade stenosis. 3. Normal cervical carotid and vertebral arteries. Electronically Signed   By: Deatra Robinson M.D.   On: 06/07/2020  21:22  MR BRAIN WO CONTRAST  Result Date: 06/07/2020 CLINICAL DATA:  Right-sided weakness EXAM: MRI HEAD WITHOUT CONTRAST TECHNIQUE: Multiplanar, multiecho pulse sequences of the brain and surrounding structures were obtained without intravenous contrast. COMPARISON:  CTA head neck 06/07/2020 FINDINGS: Brain: There are areas of abnormal diffusion restriction in the left thalamus and medial temporal lobe. No acute or chronic hemorrhage. There is multifocal hyperintense T2-weighted signal within the white matter. Parenchymal volume and CSF spaces are normal. The midline structures are normal. Vascular: Major flow voids are preserved. Skull and upper cervical spine: Normal calvarium and skull base. Visualized upper cervical spine and soft tissues are normal. Sinuses/Orbits:No paranasal sinus fluid levels or advanced mucosal thickening. No mastoid or middle ear effusion. Normal orbits. IMPRESSION: Small acute/subacute infarcts of the left thalamus and medial temporal lobe, both within the left PCA territory. No hemorrhage or mass effect. Electronically Signed   By: Deatra Robinson M.D.   On: 06/07/2020 22:01   CT HEAD CODE STROKE WO CONTRAST  Result Date: 06/07/2020 CLINICAL DATA:  Code stroke. Acute neuro deficit. Slurred speech right facial droop EXAM: CT HEAD WITHOUT CONTRAST TECHNIQUE: Contiguous axial images were obtained from the base of the skull through the vertex without intravenous contrast. COMPARISON:  None. FINDINGS: Brain: No evidence of acute infarction, hemorrhage, hydrocephalus, extra-axial collection or mass lesion/mass effect. Vascular: Atherosclerotic calcification distal vertebral arteries bilaterally. Negative for hyperdense vessel Skull: Negative Sinuses/Orbits: Negative Other: None ASPECTS (Alberta Stroke Program Early CT Score) - Ganglionic level infarction (caudate, lentiform nuclei, internal capsule, insula, M1-M3 cortex): 7 - Supraganglionic infarction (M4-M6 cortex): 3 Total score  (0-10 with 10 being normal): 10 IMPRESSION: 1. No acute intracranial abnormality. 2. ASPECTS is 10 3. Code stroke imaging results were communicated on 06/07/2020 at 6:03 pm to provider Bhagat via text page Electronically Signed   By: Marlan Palau M.D.   On: 06/07/2020 18:04    PHYSICAL EXAM  Temp:  [98.2 F (36.8 C)-98.8 F (37.1 C)] 98.5 F (36.9 C) (04/19 0800) Pulse Rate:  [43-164] 86 (04/19 1000) Resp:  [15-25] 18 (04/19 1000) BP: (102-155)/(69-134) 131/86 (04/19 1000) SpO2:  [89 %-98 %] 92 % (04/19 1000) Weight:  [91 kg] 91 kg (04/18 1700)  General - Well nourished, well developed, in no apparent distress.  Ophthalmologic - fundi not visualized due to noncooperation.  Cardiovascular - irregularly irregular heart rate and rhythm.  Neuro - awake, alert, eyes open, orientated to age, place, month but not to year. No aphasia, no dysarthria, paucity of speech, following all simple commands. Able to name and repeat. No gaze palsy, tracking bilaterally, visual field full, PERRL. No facial droop. Tongue midline. Bilateral UEs 5/5, no drift. Bilaterally LEs 5/5, no drift. Sensation symmetrical bilaterally, b/l FTN and HTS intact, gait not tested.     ASSESSMENT/PLAN Mr. Ehsan Corvin is a 45 y.o. male with history of smoker and alcohol abuse admitted for right-sided weakness, right side numbness and difficulty walking. tPA given.  Found to have new diagnosed A. fib with RVR.  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  Will consider Eliquis before discharge  if financially feasible  Patient counseled to be compliant with his  antithrombotic medications  Ongoing aggressive stroke risk factor management  Therapy recommendations: None  Disposition: Pending  A. fib RVR  New diagnosis  Was on Cardizem IV, now off  Cardiology consulted  Now on metoprolol 12.5 every 6 hours  Will consider Eliquis before discharge if financially feasible  Cardiomyopathy  EF 30 to 35%  Cardiology on board  Currently on metoprolol  Currently not on the candidate for ischemic work-up  Hypertension  Stable . Permissive hypertension (OK if <180/105) for 24-48 hours post stroke and then gradually normalized within 3-5 days.  Long term BP goal normotensive  Hyperlipidemia  Home meds: None  LDL 149, goal < 70  Now on Lipitor 40  Continue statin at discharge  Tobacco abuse  Current smoker  Smoking cessation counseling provided  Pt is willing to quit  Alcohol abuse  5-6 beers per day per patient  Not on CIWA protocol  FA/MVI/B1  Alcohol limitation education provided  Other Stroke Risk Factors    Other Active Problems  Polycythemia - likely secondary to smoking  Hospital day # 1  This patient is critically ill due to left PCA stroke status post tPA, new diagnosed A. fib, cardiomyopathy and at significant risk of neurological worsening, death form stroke extension, hemorrhagic conversion, heart failure, seizure. This patient's care requires constant monitoring of vital signs, hemodynamics, respiratory and cardiac monitoring, review of multiple databases, neurological assessment, discussion with family, other specialists and medical decision making of high complexity. I spent 35 minutes of neurocritical care time in the care of this patient.  Marvel Plan, MD PhD Stroke Neurology 06/08/2020 11:54 AM    To contact Stroke Continuity provider, please refer to [email or web address]. After hours, contact General Neurology

## 2020-06-08 NOTE — Progress Notes (Signed)
OT Cancellation Note  Patient Details Name: Sollie Vultaggio MRN: [id number] DOB: 08/10/1975   Cancelled Treatment:    Reason Eval/Treat Not Completed: Active bedrest order. Will return as schedule allows.  Pepper Wyndham M Chijioke Lasser Archie Shea MSOT, OTR/L Acute Rehab Pager: [phone] Office: [phone] 06/08/2020, 7:25 AM

## 2020-06-08 NOTE — Evaluation (Signed)
Occupational Therapy Evaluation Patient Details Name: Jason Gibbs MRN: [id number] DOB: [birth year] Today's Date: 06/08/2020    History of Present Illness 45 yo male presenting to ED with R-sided weakness. Received tPA on 4/18 @ 1809. MRI showing small acute/subacute infarcts of L thalamus and medial temporal lobe. PMH including daily alcohol use and ongoing smoking.   Clinical Impression   PTA, pt was living with friends and was independent; working full time. Pt currently performing ADLs and functional mobility at Mod I level with increased time as needed. Pt demonstrating WFL for strength, vision, cognition, and balance. Denies weakness or numbness at RUE. Noting decreased activity tolerance as pt HR elevating to 130s (and noted a-fib) with mobility in hallway. Recommend dc to home once medically stable per physician. All acute OT needs met and will sign off. Please re-consult if functional status changes. Thank you.    Follow Up Recommendations  No OT follow up    Equipment Recommendations  None recommended by OT    Recommendations for Other Services PT consult     Precautions / Restrictions Precautions Precautions: Other (comment) Precaution Comments: watch HR Restrictions Weight Bearing Restrictions: No      Mobility Bed Mobility Overal bed mobility: Independent                  Transfers Overall transfer level: Independent Equipment used: None                  Balance Overall balance assessment: No apparent balance deficits (not formally assessed)                                         ADL either performed or assessed with clinical judgement   ADL Overall ADL's : Modified independent                                       General ADL Comments: Pt performing ADLs and functional mobility at Mod I level. Pt donning socks at EOB with supervision; noting tremors and increased effort. HR also elevating to 130s with  mobility     Vision Baseline Vision/History: No visual deficits Patient Visual Report: No change from baseline Vision Assessment?: No apparent visual deficits     Perception     Praxis      Pertinent Vitals/Pain Pain Assessment: No/denies pain     Hand Dominance Right   Extremity/Trunk Assessment Upper Extremity Assessment Upper Extremity Assessment: Overall WFL for tasks assessed (Denies weakness, numbness, or coordination difficulties)   Lower Extremity Assessment Lower Extremity Assessment: Defer to PT evaluation RLE Deficits / Details: Strength 5/5 LLE Deficits / Details: Strength 5/5   Cervical / Trunk Assessment Cervical / Trunk Assessment: Normal   Communication Communication Communication: No difficulties   Cognition Arousal/Alertness: Awake/alert Behavior During Therapy: WFL for tasks assessed/performed Overall Cognitive Status: Within Functional Limits for tasks assessed                                 General Comments: Pt following simple commands (in english). Demonstrating working memory and [facility] attention   General Comments  HR elevating to 130s with mobility; noting Afib as well    Exercises     Shoulder Instructions  Home Living Family/patient expects to be discharged to:: Private residence Living Arrangements: Non-relatives/Friends (3 roommates) Available Help at Discharge: Friend(s);Available PRN/intermittently Type of Home: House Home Access: Level entry     Home Layout: One level     Bathroom Shower/Tub: Teacher, early years/pre: Standard     Home Equipment: None          Prior Functioning/Environment Level of Independence: Independent        Comments: Waiter at [employer]        OT Problem List: Decreased activity tolerance;Decreased knowledge of precautions      OT Treatment/Interventions:      OT Goals(Current goals can be found in the care plan section) Acute Rehab OT  Goals Patient Stated Goal: Return home and to normal life OT Goal Formulation: All assessment and education complete, DC therapy  OT Frequency:     Barriers to D/C:            Co-evaluation              AM-PAC OT "6 Clicks" Daily Activity     Outcome Measure Help from another person eating meals?: None Help from another person taking care of personal grooming?: None Help from another person toileting, which includes using toliet, bedpan, or urinal?: None Help from another person bathing (including washing, rinsing, drying)?: None Help from another person to put on and taking off regular upper body clothing?: None Help from another person to put on and taking off regular lower body clothing?: None 6 Click Score: 24   End of Session Equipment Utilized During Treatment: Gait belt Nurse Communication: Mobility status  Activity Tolerance: Patient tolerated treatment well Patient left: in chair;with call bell/phone within reach;with chair alarm set  OT Visit Diagnosis: Other abnormalities of gait and mobility (R26.89);Muscle weakness (generalized) (M62.81)                Time: 4492-0100 OT Time Calculation (min): 20 min Charges:  OT General Charges $OT Visit: 1 Visit OT Evaluation $OT Eval Low Complexity: [place], OTR/L Acute Rehab Pager: [phone] Office: [place] 06/08/2020, 5:07 PM

## 2020-06-09 DIAGNOSIS — I5022 Chronic systolic (congestive) heart failure: Secondary | ICD-10-CM

## 2020-06-09 LAB — CBC
HCT: 56.1 % — ABNORMAL HIGH (ref 39.0–52.0)
Hemoglobin: 18.6 g/dL — ABNORMAL HIGH (ref 13.0–17.0)
MCH: 30 pg (ref 26.0–34.0)
MCHC: 33.2 g/dL (ref 30.0–36.0)
MCV: 90.6 fL (ref 80.0–100.0)
Platelets: 129 10*3/uL — ABNORMAL LOW (ref 150–400)
RBC: 6.19 MIL/uL — ABNORMAL HIGH (ref 4.22–5.81)
RDW: 12.8 % (ref 11.5–15.5)
WBC: 5.3 10*3/uL (ref 4.0–10.5)
nRBC: 0 % (ref 0.0–0.2)

## 2020-06-09 LAB — BASIC METABOLIC PANEL
Anion gap: 9 (ref 5–15)
BUN: 9 mg/dL (ref 6–20)
CO2: 24 mmol/L (ref 22–32)
Calcium: 9.4 mg/dL (ref 8.9–10.3)
Chloride: 103 mmol/L (ref 98–111)
Creatinine, Ser: 0.81 mg/dL (ref 0.61–1.24)
GFR, Estimated: >60 mL/min (ref >60)
Glucose, Bld: 98 mg/dL (ref 70–99)
Potassium: 3.8 mmol/L (ref 3.5–5.1)
Sodium: 136 mmol/L (ref 135–145)

## 2020-06-09 LAB — HEPARIN LEVEL (UNFRACTIONATED): Heparin Unfractionated: 0.15 IU/mL — ABNORMAL LOW (ref 0.30–0.70)

## 2020-06-09 MED ORDER — METOPROLOL TARTRATE 25 MG PO TABS
25.0000 mg | ORAL_TABLET | Freq: Three times a day (TID) | ORAL | Status: DC
  Administered 2020-06-09: 25 mg via ORAL
  Filled 2020-06-09: qty 1

## 2020-06-09 MED ORDER — METOPROLOL TARTRATE 25 MG PO TABS
25.0000 mg | ORAL_TABLET | Freq: Four times a day (QID) | ORAL | Status: DC
  Administered 2020-06-09 – 2020-06-10 (×3): 25 mg via ORAL
  Filled 2020-06-09 (×3): qty 1

## 2020-06-09 MED ORDER — HEPARIN (PORCINE) 25000 UT/250ML-% IV SOLN
1350.0000 [IU]/h | INTRAVENOUS | Status: AC
  Administered 2020-06-09: 1200 [IU]/h via INTRAVENOUS
  Administered 2020-06-10: 1350 [IU]/h via INTRAVENOUS
  Filled 2020-06-09 (×2): qty 250

## 2020-06-09 MED ORDER — METOPROLOL TARTRATE 25 MG PO TABS: 25.0000 mg | ORAL_TABLET | Freq: Three times a day (TID) | ORAL | Status: DC

## 2020-06-09 MED ORDER — DIGOXIN 125 MCG PO TABS
0.2500 mg | ORAL_TABLET | Freq: Every day | ORAL | Status: DC
  Administered 2020-06-09 – 2020-06-12 (×4): 0.25 mg via ORAL
  Filled 2020-06-09 (×3): qty 1
  Filled 2020-06-09: qty 2

## 2020-06-09 NOTE — Progress Notes (Signed)
Physical Therapy Treatment and Discharge Patient Details Name: Jason Gibbs MRN: [id number] DOB: [birth year] Today's Date: 06/09/2020    History of Present Illness 45 yo male presenting to ED with R-sided weakness. Received tPA on 4/18 @ 1809. MRI showing small acute/subacute infarcts of L thalamus and medial temporal lobe. PMH including daily alcohol use and ongoing smoking.    PT Comments    Pt ambulating x 500 feet with no assistive device and negotiated 3 steps without physical difficulty. Scoring 24/24 on the Dynamic Gait Index, indicating he is not at high risk for falls. HR still elevated and irregular with mobility, 120's-160 afib. Pt asymptomatic and denies fatigue, shortness of breath, or chest palpitations. Pt with no further acute PT needs. Thank you for this consult.     Follow Up Recommendations  No PT follow up     Equipment Recommendations  None recommended by PT    Recommendations for Other Services       Precautions / Restrictions Precautions Precautions: Other (comment) Precaution Comments: watch HR Restrictions Weight Bearing Restrictions: No    Mobility  Bed Mobility Overal bed mobility: Independent                  Transfers Overall transfer level: Independent Equipment used: None                Ambulation/Gait Ambulation/Gait assistance: Independent Gait Distance (Feet): 500 Feet Assistive device: None Gait Pattern/deviations: WFL(Within Functional Limits)         Stairs Stairs: Yes Stairs assistance: Independent Stair Management: No rails Number of Stairs: 3 General stair comments: Step over step pattern   Wheelchair Mobility    Modified Rankin (Stroke Patients Only) Modified Rankin (Stroke Patients Only) Pre-Morbid Rankin Score: No symptoms Modified Rankin: No significant disability     Balance Overall balance assessment: No apparent balance deficits (not formally assessed)                                Standardized Balance Assessment Standardized Balance Assessment : Dynamic Gait Index   Dynamic Gait Index Level Surface: Normal Change in Gait Speed: Normal Gait with Horizontal Head Turns: Normal Gait with Vertical Head Turns: Normal Gait and Pivot Turn: Normal Step Over Obstacle: Normal Step Around Obstacles: Normal Steps: Normal Total Score: 24      Cognition Arousal/Alertness: Awake/alert Behavior During Therapy: WFL for tasks assessed/performed Overall Cognitive Status: Within Functional Limits for tasks assessed                                        Exercises Other Exercises Other Exercises: Side stepping x 20 ft to right and left Other Exercises: Backwards walking x 20 ft    General Comments        Pertinent Vitals/Pain Pain Assessment: No/denies pain    Home Living                      Prior Function            PT Goals (current goals can now be found in the care plan section) Acute Rehab PT Goals Patient Stated Goal: did not state PT Goal Formulation: With patient Time For Goal Achievement: 06/22/20 Potential to Achieve Goals: Good Progress towards PT goals: Goals met/education completed, patient discharged from PT    Frequency  Min 4X/week      PT Plan Other (comment) (d/c therapies)    Co-evaluation              AM-PAC PT "6 Clicks" Mobility   Outcome Measure  Help needed turning from your back to your side while in a flat bed without using bedrails?: None Help needed moving from lying on your back to sitting on the side of a flat bed without using bedrails?: None Help needed moving to and from a bed to a chair (including a wheelchair)?: None Help needed standing up from a chair using your arms (e.g., wheelchair or bedside chair)?: None Help needed to walk in hospital room?: None Help needed climbing 3-5 steps with a railing? : None 6 Click Score: 24    End of Session   Activity Tolerance:  Patient tolerated treatment well Patient left: with call bell/phone within reach;in bed Nurse Communication: Mobility status PT Visit Diagnosis: Other symptoms and signs involving the nervous system (R29.898)     Time: 3868-5488 PT Time Calculation (min) (ACUTE ONLY): 17 min  Charges:  $Therapeutic Activity: 8-22 mins                     Wyona Almas, PT, DPT Acute Rehabilitation Services Pager [phone] Office [phone]    Deno Etienne 06/09/2020, 12:55 PM

## 2020-06-09 NOTE — Progress Notes (Signed)
NAME:  Jason Gibbs, MRN:  [id number], DOB:  1975-05-10, LOS: 2 ADMISSION DATE:  06/07/2020, CONSULTATION DATE:  06/09/20 REFERRING MD:  Iver Nestle, CHIEF COMPLAINT:   R-sided weakness  History of Present Illness:  45 y.o. M with PMH tobacco and alcohol use who was at work when he developed R facial droop, R hand weakness and gait difficulty. Last known well 1600 on 4/18.  He was taken to the ED as a code stroke and received TPA.  Initial head CT was without acute findings.  Labs with slightly elevated LFT's  and Na 133.    He was evaluated and admitted by neurology.   CTA was obtained with distal L P2 occlusion and cardiac rhythm.   He was in atrial fibrillation with RVR during ED course and was started on Cardizem gtt.  He had no hypotension or electrolyte abnormalities.  He remained in RVR despite maximum Cardizem gtt, so PCCM was consulted.  Pt is awake and alert, states that he has intermittently had palpitations for years and sometimes has exertional shortness of breath.  He denies current palpitations or dyspnea and is stable on RA.  Denies chest pain  Pertinent  Medical History  Tobacco and alcohol use  Significant Hospital Events: Including procedures, antibiotic start and stop dates in addition to other pertinent events   . 4/18 Presented to ED, given TPA, PCCM consult for afib  Interim History / Subjective:  Patient heart rate is better controlled with intermittently spikes in 1 teens to 120s on metoprolol 12.5 mg every 6 hours  Objective   Blood pressure (!) 129/92, pulse 97, temperature 99.2 F (37.3 C), temperature source Oral, resp. rate 19, weight 88.8 kg, SpO2 96 %.        Intake/Output Summary (Last 24 hours) at 06/09/2020 0846 Last data filed at 06/09/2020 0630 Gross per 24 hour  Intake 231.21 ml  Output 1200 ml  Net -968.79 ml   Filed Weights   06/07/20 1700 06/09/20 0400  Weight: 91 kg 88.8 kg    General:  Well-nourished, middle aged M, lying in the bed HEENT:  MM pink/dry, no facial droop, pupils equal, no JVD Neuro: awake, alert, conversational without facial droop, dysarthria or confusion, antigravity in all 4 extremities CV: Irregularly irregular, tachycardic, no murmur or gallop PULM:  Clear bilaterally, no wheezes GI: soft, nondistended, nontender bsx4 active, Extremities: warm/dry, no edema  Skin: no rashes or lesions   Labs/imaging that I havepersonally reviewed  (right click and "Reselect all SmartList Selections" daily)  MRI brain: Small acute/subacute infarcts of the left thalamus and medial temporal lobe, both within the left PCA territory. No hemorrhage or mass effect.  Echocardiogram showed EF 30-35% with global hypokinesis Resolved Hospital Problem list     Assessment & Plan:   New diagnosis of Afib with RVR Patient presented with rapid reticular response in A. 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 Continue secondary stroke prophylaxis MRI confirmed left mesial temporal lobe and left thalamic stroke Management per stroke team  New diagnosis systolic congestive heart failure Echocardiogram is suggestive of EF 30 to 35% likely related  to tachycardia/alcohol abuse Appreciate cardiology input Started on metoprolol which will be switched to Toprol at discharge With outpatient follow-up  Alcohol abuse Tobacco dependence Patient's MRI showed a probably Started on thiamine and folate Watch for signs of withdrawal Continue nicotine patch  Best practice (right click and "Reselect all SmartList Selections"  daily)  Per primary Code Status:  full code Disposition: Patient can be transferred out of ICU  Labs   CBC: Recent Labs  Lab 06/07/20 1747 06/08/20 0330 06/08/20 1246 06/09/20 0712  WBC 5.6 6.7 6.1 5.3  NEUTROABS 3.0  --   --   --   HGB 16.1 17.5* 17.7* 18.6*  HCT 47.7 50.9 51.8 56.1*  MCV 89.3 87.9 87.9 90.6  PLT 142* 153 159 129*    Basic Metabolic Panel: Recent Labs  Lab 06/07/20 1900 06/08/20 0330 06/08/20 1246 06/09/20 0712  NA 133* 135  --  136  K 3.7 4.0  --  3.8  CL 100 103  --  103  CO2 20* 21*  --  24  GLUCOSE 125* 115*  --  98  BUN 9 5*  --  9  CREATININE 0.68 0.68  --  0.81  CALCIUM 8.4* 8.9  --  9.4  MG 2.1  --  2.0  --   PHOS  --   --  2.8  --    GFR: CrCl cannot be calculated (Unknown ideal weight.). Recent Labs  Lab 06/07/20 1747 06/08/20 0330 06/08/20 1246 06/09/20 0712  WBC 5.6 6.7 6.1 5.3    Liver Function Tests: Recent Labs  Lab 06/07/20 1900  AST 69*  ALT 59*  ALKPHOS 59  BILITOT 0.7  PROT 7.0  ALBUMIN 3.9   No results for input(s): LIPASE, AMYLASE in the last 168 hours. No results for input(s): AMMONIA in the last 168 hours.  ABG No results found for: PHART, PCO2ART, PO2ART, HCO3, TCO2, ACIDBASEDEF, O2SAT   Coagulation Profile: Recent Labs  Lab 06/07/20 1900  INR 1.2    Cardiac Enzymes: No results for input(s): CKTOTAL, CKMB, CKMBINDEX, TROPONINI in the last 168 hours.  HbA1C: Hgb A1c MFr Bld  Date/Time Value Ref Range Status  06/08/2020 03:30 AM 5.6 4.8 - 5.6 % Final    Comment:    (NOTE) Pre diabetes:          5.7%-6.4%  Diabetes:              >6.4%  Glycemic control for   <7.0% adults with diabetes     CBG: Recent Labs  Lab 06/07/20 1748  GLUCAP 126*        Cheri Fowler MD [facility] See Amion for pager If no response to pager, please call [phone] until 7pm After 7pm, Please call E-link [phone]

## 2020-06-09 NOTE — Progress Notes (Signed)
ANTICOAGULATION CONSULT NOTE - Initial Consult  Pharmacy Consult for heparin Indication: pulmonary embolus  No Known Allergies  Patient Measurements: Weight: 88.8 kg (195 lb 12.3 oz) Heparin Dosing Weight: 88.8 kg   Vital Signs: Temp: 99.2 F (37.3 C) (04/20 0700) Temp Source: Oral (04/20 0700) BP: 129/92 (04/20 0700) Pulse Rate: 132 (04/20 1053)  Labs: Recent Labs    06/07/20 1900 06/08/20 0330 06/08/20 1246 06/09/20 0712  HGB  --  17.5* 17.7* 18.6*  HCT  --  50.9 51.8 56.1*  PLT  --  153 159 129*  APTT 24  --   --   --   LABPROT 14.7  --   --   --   INR 1.2  --   --   --   CREATININE 0.68 0.68  --  0.81    CrCl cannot be calculated (Unknown ideal weight.).   Medical History: History reviewed. No pertinent past medical history.  Medications:  No medications prior to admission.    Assessment: 77 YOM with h/o of Afib who presented with R-sided deficits found to have a L P2 occlusion s/p tPA. Pharmacy consulted to start IV heparin for AFib and secondary stroke prevention.   H/H high, Plt low, SCr wnl    Goal of Therapy:  Heparin level 0.3-0.5 units/ml Monitor platelets by anticoagulation protocol: Yes   Plan:  -Start heparin 1200 units/hr. No bolus  -F/u 6 hr HL -Monitor daily HL, CBC and s/s of bleeding   Vinnie Level, PharmD., BCPS, BCCCP Clinical Pharmacist Please refer to [facility] for unit-specific pharmacist

## 2020-06-09 NOTE — Progress Notes (Addendum)
STROKE TEAM PROGRESS NOTE   SUBJECTIVE (INTERVAL HISTORY) No acute events overnight.  He denies any new concerns today.  He remains on heparin drip for a fib.   He denies that alcohol or drugs are a problem in his life.  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 He does not have health insurance or a primary doctor.  He was counseled to discontinue smoking, THC use and minimize or eliminate ETOH.  We discussed his stroke and a-fib diagnoses, plan of care including need for PCP and long term medication plan for atrial fib. He stated understanding and denies questions.   OBJECTIVE Temp:  [98.4 F (36.9 C)-99.3 F (37.4 C)] 99.2 F (37.3 C) (04/20 0700) Pulse Rate:  [97-132] 132 (04/20 1053) Cardiac Rhythm: Atrial fibrillation (04/20 0730) Resp:  [14-24] 19 (04/20 0700) BP: (119-163)/(78-107) 129/92 (04/20 0700) SpO2:  [94 %-96 %] 96 % (04/20 0700) Weight:  [88.8 kg] 88.8 kg (04/20 0400)  Recent Labs  Lab 06/07/20 1748  GLUCAP 126*   Recent Labs  Lab 06/07/20 1900 06/08/20 0330 06/08/20 1246 06/09/20 0712  NA 133* 135  --  136  K 3.7 4.0  --  3.8  CL 100 103  --  103  CO2 20* 21*  --  24  GLUCOSE 125* 115*  --  98  BUN 9 5*  --  9  CREATININE 0.68 0.68  --  0.81  CALCIUM 8.4* 8.9  --  9.4  MG 2.1  --  2.0  --   PHOS  --   --  2.8  --    Recent Labs  Lab 06/07/20 1900  AST 69*  ALT 59*  ALKPHOS 59  BILITOT 0.7  PROT 7.0  ALBUMIN 3.9   Recent Labs  Lab 06/07/20 1747 06/08/20 0330 06/08/20 1246 06/09/20 0712  WBC 5.6 6.7 6.1 5.3  NEUTROABS 3.0  --   --   --   HGB 16.1 17.5* 17.7* 18.6*  HCT 47.7 50.9 51.8 56.1*  MCV 89.3 87.9 87.9 90.6  PLT 142* 153 159 129*   No results for input(s): CKTOTAL, CKMB, CKMBINDEX, TROPONINI in the last 168 hours. Recent Labs    06/07/20 1900  LABPROT 14.7  INR 1.2   No results for input(s): COLORURINE, LABSPEC, PHURINE, GLUCOSEU, HGBUR, BILIRUBINUR, KETONESUR, PROTEINUR, UROBILINOGEN, NITRITE,  LEUKOCYTESUR in the last 72 hours.  Invalid input(s): APPERANCEUR     Component Value Date/Time   CHOL 214 (H) 06/08/2020 0330   TRIG 104 06/08/2020 0330   HDL 44 06/08/2020 0330   CHOLHDL 4.9 06/08/2020 0330   VLDL 21 06/08/2020 0330   LDLCALC 149 (H) 06/08/2020 0330   Lab Results  Component Value Date   HGBA1C 5.6 06/08/2020      Component Value Date/Time   LABOPIA NONE DETECTED 06/08/2020 1224   COCAINSCRNUR NONE DETECTED 06/08/2020 1224   LABBENZ NONE DETECTED 06/08/2020 1224   AMPHETMU NONE DETECTED 06/08/2020 1224   THCU NONE DETECTED 06/08/2020 1224   LABBARB NONE DETECTED 06/08/2020 1224    Recent Labs  Lab 06/07/20 2227  ETH 71*    I have personally reviewed the radiological images below and agree with the radiology interpretations.  CT ANGIO HEAD W OR WO CONTRAST  Result Date: 06/07/2020 CLINICAL DATA:  Stroke/TIA, assess extracranial arteries EXAM: CT ANGIOGRAPHY HEAD AND NECK TECHNIQUE: Multidetector CT imaging of the head and neck was performed using the standard protocol during bolus administration of intravenous contrast. Multiplanar CT  image reconstructions and MIPs were obtained to evaluate the vascular anatomy. Carotid stenosis measurements (when applicable) are obtained utilizing NASCET criteria, using the distal internal carotid diameter as the denominator. CONTRAST:  75mL OMNIPAQUE IOHEXOL 350 MG/ML SOLN COMPARISON:  None. FINDINGS: CT HEAD FINDINGS Brain: There is no mass, hemorrhage or extra-axial collection. The size and configuration of the ventricles and extra-axial CSF spaces are normal. There is no acute or chronic infarction. The brain parenchyma is normal. Skull: The visualized skull base, calvarium and extracranial soft tissues are normal. Sinuses/Orbits: No fluid levels or advanced mucosal thickening of the visualized paranasal sinuses. No mastoid or middle ear effusion. The orbits are normal. CTA NECK FINDINGS SKELETON: There is no bony spinal canal  stenosis. No lytic or blastic lesion. OTHER NECK: Normal pharynx, larynx and major salivary glands. No cervical lymphadenopathy. Unremarkable thyroid gland. UPPER CHEST: No pneumothorax or pleural effusion. No nodules or masses. AORTIC ARCH: There is no calcific atherosclerosis of the aortic arch. There is no aneurysm, dissection or hemodynamically significant stenosis of the visualized portion of the aorta. Conventional 3 vessel aortic branching pattern. The visualized proximal subclavian arteries are widely patent. RIGHT CAROTID SYSTEM: Normal without aneurysm, dissection or stenosis. LEFT CAROTID SYSTEM: Normal without aneurysm, dissection or stenosis. VERTEBRAL ARTERIES: Left dominant configuration. Both origins are clearly patent. There is no dissection, occlusion or flow-limiting stenosis to the skull base (V1-V3 segments). CTA HEAD FINDINGS POSTERIOR CIRCULATION: --Vertebral arteries: Normal V4 segments. --Inferior cerebellar arteries: Normal. --Basilar artery: Normal. --Superior cerebellar arteries: Normal. --Posterior cerebral arteries (PCA): Distal left P2 segment occlusion (14:23). Normal right. ANTERIOR CIRCULATION: --Intracranial internal carotid arteries: Normal. --Anterior cerebral arteries (ACA): Normal. Both A1 segments are present. Patent anterior communicating artery (a-comm). --Middle cerebral arteries (MCA): Normal. VENOUS SINUSES: As permitted by contrast timing, patent. ANATOMIC VARIANTS: None Review of the MIP images confirms the above findings. IMPRESSION: 1. Distal left P2 segment occlusion. 2. No other intracranial arterial occlusion or high-grade stenosis. 3. Normal cervical carotid and vertebral arteries. Electronically Signed   By: Deatra RobinsonKevin  Herman M.D.   On: 06/07/2020 21:22   CT ANGIO NECK W OR WO CONTRAST  Result Date: 06/07/2020 CLINICAL DATA:  Stroke/TIA, assess extracranial arteries EXAM: CT ANGIOGRAPHY HEAD AND NECK TECHNIQUE: Multidetector CT imaging of the head and neck was  performed using the standard protocol during bolus administration of intravenous contrast. Multiplanar CT image reconstructions and MIPs were obtained to evaluate the vascular anatomy. Carotid stenosis measurements (when applicable) are obtained utilizing NASCET criteria, using the distal internal carotid diameter as the denominator. CONTRAST:  75mL OMNIPAQUE IOHEXOL 350 MG/ML SOLN COMPARISON:  None. FINDINGS: CT HEAD FINDINGS Brain: There is no mass, hemorrhage or extra-axial collection. The size and configuration of the ventricles and extra-axial CSF spaces are normal. There is no acute or chronic infarction. The brain parenchyma is normal. Skull: The visualized skull base, calvarium and extracranial soft tissues are normal. Sinuses/Orbits: No fluid levels or advanced mucosal thickening of the visualized paranasal sinuses. No mastoid or middle ear effusion. The orbits are normal. CTA NECK FINDINGS SKELETON: There is no bony spinal canal stenosis. No lytic or blastic lesion. OTHER NECK: Normal pharynx, larynx and major salivary glands. No cervical lymphadenopathy. Unremarkable thyroid gland. UPPER CHEST: No pneumothorax or pleural effusion. No nodules or masses. AORTIC ARCH: There is no calcific atherosclerosis of the aortic arch. There is no aneurysm, dissection or hemodynamically significant stenosis of the visualized portion of the aorta. Conventional 3 vessel aortic branching pattern. The visualized proximal subclavian  arteries are widely patent. RIGHT CAROTID SYSTEM: Normal without aneurysm, dissection or stenosis. LEFT CAROTID SYSTEM: Normal without aneurysm, dissection or stenosis. VERTEBRAL ARTERIES: Left dominant configuration. Both origins are clearly patent. There is no dissection, occlusion or flow-limiting stenosis to the skull base (V1-V3 segments). CTA HEAD FINDINGS POSTERIOR CIRCULATION: --Vertebral arteries: Normal V4 segments. --Inferior cerebellar arteries: Normal. --Basilar artery: Normal.  --Superior cerebellar arteries: Normal. --Posterior cerebral arteries (PCA): Distal left P2 segment occlusion (14:23). Normal right. ANTERIOR CIRCULATION: --Intracranial internal carotid arteries: Normal. --Anterior cerebral arteries (ACA): Normal. Both A1 segments are present. Patent anterior communicating artery (a-comm). --Middle cerebral arteries (MCA): Normal. VENOUS SINUSES: As permitted by contrast timing, patent. ANATOMIC VARIANTS: None Review of the MIP images confirms the above findings. IMPRESSION: 1. Distal left P2 segment occlusion. 2. No other intracranial arterial occlusion or high-grade stenosis. 3. Normal cervical carotid and vertebral arteries. Electronically Signed   By: Deatra Robinson M.D.   On: 06/07/2020 21:22   MR BRAIN WO CONTRAST  Result Date: 06/08/2020 CLINICAL DATA:  Stroke follow-up EXAM: MRI HEAD WITHOUT CONTRAST TECHNIQUE: Multiplanar, multiecho pulse sequences of the brain and surrounding structures were obtained without intravenous contrast. COMPARISON:  06/07/2020 FINDINGS: Brain: Small area abnormal diffusion restriction in the left thalamus and medial left temporal lobe. Punctate foci of abnormal diffusion restriction in the left occipital lobe. No acute or chronic hemorrhage. Skull and upper cervical spine: Normal calvarium and skull base. Visualized upper cervical spine and soft tissues are normal. Sinuses/Orbits:No paranasal sinus fluid levels or advanced mucosal thickening. No mastoid or middle ear effusion. Normal orbits. IMPRESSION: There are a few new punctate foci of acute ischemia in the left PCA territory, in addition to the unchanged left thalamic and medial temporal lobe infarcts Electronically Signed   By: Deatra Robinson M.D.   On: 06/08/2020 19:25   MR BRAIN WO CONTRAST  Result Date: 06/07/2020 CLINICAL DATA:  Right-sided weakness EXAM: MRI HEAD WITHOUT CONTRAST TECHNIQUE: Multiplanar, multiecho pulse sequences of the brain and surrounding structures were  obtained without intravenous contrast. COMPARISON:  CTA head neck 06/07/2020 FINDINGS: Brain: There are areas of abnormal diffusion restriction in the left thalamus and medial temporal lobe. No acute or chronic hemorrhage. There is multifocal hyperintense T2-weighted signal within the white matter. Parenchymal volume and CSF spaces are normal. The midline structures are normal. Vascular: Major flow voids are preserved. Skull and upper cervical spine: Normal calvarium and skull base. Visualized upper cervical spine and soft tissues are normal. Sinuses/Orbits:No paranasal sinus fluid levels or advanced mucosal thickening. No mastoid or middle ear effusion. Normal orbits. IMPRESSION: Small acute/subacute infarcts of the left thalamus and medial temporal lobe, both within the left PCA territory. No hemorrhage or mass effect. Electronically Signed   By: Deatra Robinson M.D.   On: 06/07/2020 22:01   ECHOCARDIOGRAM COMPLETE  Result Date: 06/08/2020    ECHOCARDIOGRAM REPORT   Patient Name:   CURLIE MACKEN Date of Exam: 06/08/2020 Medical Rec #:  [id number]     Height:       72.0 in Accession #:    [id number]    Weight:       200.6 lb Date of Birth:  09/04/1975    BSA:          2.133 m Patient Age:    44 years      BP:           114/87 mmHg Patient Gender: M  HR:           95 bpm. Exam Location:  Inpatient Procedure: 2D Echo Indications:    Stroke; Atrial Fibrillation I48.91  History:        Patient has no prior history of Echocardiogram examinations.  Sonographer:    Thurman Coyer RDCS (AE) Referring Phys: [id number] SRISHTI L BHAGAT IMPRESSIONS  1. Left ventricular ejection fraction, by estimation, is 30 to 35%. The left ventricle has moderately decreased function. The left ventricle demonstrates global hypokinesis. Left ventricular diastolic function could not be evaluated.  2. Right ventricular systolic function is moderately reduced. The right ventricular size is mildly enlarged. Tricuspid regurgitation  signal is inadequate for assessing PA pressure.  3. Left atrial size was moderately dilated.  4. The mitral valve is normal in structure. Mild to moderate mitral valve regurgitation.  5. The aortic valve is normal in structure. Aortic valve regurgitation is not visualized.  6. The inferior vena cava is dilated in size with >50% respiratory variability, suggesting right atrial pressure of 8 mmHg. FINDINGS  Left Ventricle: Left ventricular ejection fraction, by estimation, is 30 to 35%. The left ventricle has moderately decreased function. The left ventricle demonstrates global hypokinesis. The left ventricular internal cavity size was normal in size. There is no left ventricular hypertrophy. Left ventricular diastolic function could not be evaluated due to atrial fibrillation. Left ventricular diastolic function could not be evaluated. Right Ventricle: The right ventricular size is mildly enlarged. No increase in right ventricular wall thickness. Right ventricular systolic function is moderately reduced. Tricuspid regurgitation signal is inadequate for assessing PA pressure. Left Atrium: Left atrial size was moderately dilated. Right Atrium: Right atrial size was normal in size. Pericardium: There is no evidence of pericardial effusion. Mitral Valve: The mitral valve is normal in structure. Mild to moderate mitral valve regurgitation, with centrally-directed jet. Tricuspid Valve: The tricuspid valve is normal in structure. Tricuspid valve regurgitation is not demonstrated. Aortic Valve: The aortic valve is normal in structure. Aortic valve regurgitation is not visualized. Pulmonic Valve: The pulmonic valve was grossly normal. Pulmonic valve regurgitation is not visualized. Aorta: The aortic root and ascending aorta are structurally normal, with no evidence of dilitation. Venous: The inferior vena cava is dilated in size with greater than 50% respiratory variability, suggesting right atrial pressure of 8 mmHg.  IAS/Shunts: No atrial level shunt detected by color flow Doppler.  LEFT VENTRICLE PLAX 2D LVIDd:         5.00 cm LVIDs:         4.10 cm LV PW:         1.00 cm LV IVS:        1.00 cm LVOT diam:     2.10 cm LV SV:         52 LV SV Index:   24 LVOT Area:     3.46 cm  RIGHT VENTRICLE RV Basal diam:  4.40 cm RV S prime:     7.72 cm/s TAPSE (M-mode): 1.1 cm LEFT ATRIUM           Index       RIGHT ATRIUM           Index LA diam:      4.50 cm 2.11 cm/m  RA Area:     16.00 cm LA Vol (A2C): 60.3 ml 28.27 ml/m RA Volume:   41.20 ml  19.31 ml/m LA Vol (A4C): 60.4 ml 28.33 ml/m  AORTIC VALVE LVOT Vmax:   93.70 cm/s LVOT Vmean:  61.067 cm/s LVOT VTI:    0.149 m  AORTA Ao Root diam: 3.50 cm MR Peak grad: 77.4 mmHg MR Mean grad: 55.0 mmHg   SHUNTS MR Vmax:      440.00 cm/s Systemic VTI:  0.15 m MR Vmean:     357.0 cm/s  Systemic Diam: 2.10 cm Thurmon Fair MD Electronically signed by Thurmon Fair MD Signature Date/Time: 06/08/2020/12:42:02 PM    Final    CT HEAD CODE STROKE WO CONTRAST  Result Date: 06/07/2020 CLINICAL DATA:  Code stroke. Acute neuro deficit. Slurred speech right facial droop EXAM: CT HEAD WITHOUT CONTRAST TECHNIQUE: Contiguous axial images were obtained from the base of the skull through the vertex without intravenous contrast. COMPARISON:  None. FINDINGS: Brain: No evidence of acute infarction, hemorrhage, hydrocephalus, extra-axial collection or mass lesion/mass effect. Vascular: Atherosclerotic calcification distal vertebral arteries bilaterally. Negative for hyperdense vessel Skull: Negative Sinuses/Orbits: Negative Other: None ASPECTS (Alberta Stroke Program Early CT Score) - Ganglionic level infarction (caudate, lentiform nuclei, internal capsule, insula, M1-M3 cortex): 7 - Supraganglionic infarction (M4-M6 cortex): 3 Total score (0-10 with 10 being normal): 10 IMPRESSION: 1. No acute intracranial abnormality. 2. ASPECTS is 10 3. Code stroke imaging results were communicated on 06/07/2020 at 6:03  pm to provider Bhagat via text page Electronically Signed   By: Marlan Palau M.D.   On: 06/07/2020 18:04    PHYSICAL EXAM  Temp:  [98.4 F (36.9 C)-99.3 F (37.4 C)] 99.2 F (37.3 C) (04/20 0700) Pulse Rate:  [97-132] 132 (04/20 1053) Resp:  [14-24] 19 (04/20 0700) BP: (119-163)/(78-107) 129/92 (04/20 0700) SpO2:  [94 %-96 %] 96 % (04/20 0700) Weight:  [88.8 kg] 88.8 kg (04/20 0400)  General - Well nourished, well developed, in no apparent distress.  Ophthalmologic - fundi not visualized due to noncooperation.  Cardiovascular - irregularly irregular heart rate and rhythm.  Neuro - awake, alert, oriented x4.  No aphasia, no dysarthria, paucity of speech, following all simple commands. Able to name and repeat. No gaze palsy, tracking bilaterally, visual field exam showed right upper quadrantanopia, right lower quadrant decreased visual acuity. PERRL. No facial droop. Tongue midline. Bilateral UEs 5/5, no drift. Bilaterally LEs 5/5, no drift. Sensation symmetrical bilaterally, b/l FTN and HTS intact, gait not tested.   ASSESSMENT/PLAN Mr. Sanders Manninen is a 45 y.o. male with history of smoker and alcohol abuse admitted for right-sided weakness, right side numbness and difficulty walking. tPA given.  Found to have new diagnosed A. fib with RVR.  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  Transitions of care team consulted to advise re: financial assistance for medication options (Xarelto recommended by cardiology d/t once a day dosing) and developing appropriate discharge plan.   Patient counseled to be compliant with his antithrombotic  medications  Ongoing aggressive stroke risk factor management  Therapy recommendations: Cleared without needs  Disposition: Home   Transfer to floor today  A. fib RVR  New diagnosis  Was on Cardizem IV, now off  Cardiology consulted  On digoxin  Now on metoprolol 25 every 6 hours, cardiology titrating  Financial considerations  for discharge [facility] plan are being investigated  Cardiomyopathy  EF 30 to 35%  Cardiology on board  Currently on metoprolol  Currently not candidate for ischemic work-up  Hypertension  Stable . Permissive hypertension (OK if <180/105) for 24-48 hours post stroke and then gradually normalized within 3-5 days.  Long term BP goal normotensive  Hyperlipidemia  Home meds: None  LDL 149, goal < 70  Now on Lipitor 40  Continue statin at discharge  Tobacco abuse  Current smoker  Smoking cessation counseling provided  Pt is willing to quit  Alcohol abuse  5-6 beers per day per patient  Not on CIWA protocol  FA/MVI/B1  Alcohol limitation education provided  Other Stroke Risk Factors    Other Active Problems  Polycythemia - likely secondary to smoking  Delila A Bailey-Modzik,  NP-C   ATTENDING NOTE: I reviewed above note and agree with the assessment and plan. Pt was seen and examined.   Patient sitting in bed for lunch, voiced no complaints.  No acute event overnight, exam showed right upper quadrantanopia, right lower quadrant decreased visual acuity, otherwise intact. However, patient still has A. fib RVR, cardiology Dr. Antoine Poche on board, increased metoprolol to 25 every 6 hours.  Also added digoxin.  Social worker working on financial support for [employer] discharge.  Dr. Antoine Poche recommended Xarelto.  Given A. fib RVR, will keep in ICU for close monitoring today.  For detailed assessment and plan, please refer to above as I have made changes wherever appropriate.   Marvel Plan, MD PhD Stroke Neurology 06/10/2020 12:24  AM  This patient is critically ill due to stroke, A. fib RVR and at significant risk of neurological worsening, death form recurrent stroke, hemorrhagic conversion, heart failure. This patient's care requires constant monitoring of vital signs, hemodynamics, respiratory and cardiac monitoring, review of multiple databases, neurological assessment, discussion with family, other specialists and medical decision making of high complexity. I spent 35 minutes of neurocritical care time in the care of this patient.  I discussed with Dr. Antoine Poche cardiology and Dr. Merrily Pew CCM.   To contact Stroke Continuity provider, please refer to [email or web address]. After hours, contact General Neurology

## 2020-06-09 NOTE — Plan of Care (Incomplete)
Right upper quadrantanopia, right lower quadrant decreased visual acuity. Afib RVR

## 2020-06-09 NOTE — Progress Notes (Addendum)
Progress Note  Patient Name: Jusiah Aguayo Date of Encounter: 06/09/2020  [facility] Cardiologist: New to Dr Antoine Poche , but patient wants to follow up at  [facility] location  Subjective   Spanish interrupter used during encounter [phone]). Patient states he is feeling well today, denied any right sided numbness or weakness, had walked with PT supervision. He endorses some heart flutter sensation today, denied any chest pain, chest pressure, dizziness, syncope, SOB. He reports nausea today without emesis, had poor appetite and is not eating much.   Inpatient Medications    Scheduled Meds: . aspirin EC  325 mg Oral Daily  . atorvastatin  40 mg Oral Daily  . Chlorhexidine Gluconate Cloth  6 each Topical Daily  . folic acid  1 mg Oral Daily  . metoprolol tartrate  25 mg Oral Q8H  . multivitamin with minerals  1 tablet Oral Daily  . nicotine  14 mg Transdermal Daily  . pantoprazole  40 mg Oral Daily  . thiamine  100 mg Oral Daily   Continuous Infusions:  PRN Meds: acetaminophen **OR** acetaminophen (TYLENOL) oral liquid 160 mg/5 mL **OR** acetaminophen, labetalol, LORazepam **OR** LORazepam, senna-docusate   Vital Signs    Vitals:   06/09/20 0300 06/09/20 0400 06/09/20 0600 06/09/20 0700  BP: (!) 129/105 (!) 126/96 (!) 131/99 (!) 129/92  Pulse:    97  Resp: Temp:  99 F (37.2 C)  99.2 F (37.3 C)  TempSrc:  Oral  Oral  SpO2: 94% 96% 96% 96%  Weight:  88.8 kg      Intake/Output Summary (Last 24 hours) at 06/09/2020 1000 Last data filed at 06/09/2020 0630 Gross per 24 hour  Intake 122.75 ml  Output 1200 ml  Net -1077.25 ml   Last 3 Weights 06/09/2020 06/07/2020  Weight (lbs) 195 lb 12.3 oz 200 lb 9.9 oz  Weight (kg) 88.8 kg 91 kg      Telemetry    A Fib with intermittent ventricular rate up to 130s overnight and this morning  - Personally Reviewed  ECG    No new tracing today - Personally Reviewed  Physical Exam   GEN: No acute distress.  Ambulate independently  Neck: No JVD Cardiac:  Irregularly irregular, no murmurs, rubs, or gallops.  Respiratory: Clear to auscultation bilaterally. On room air  GI: Soft, nontender, non-distended  MS: No BLE edema; No deformity. Neuro:  Alert and oriented x3, fluent speech, follow commands appropriately, no focal weakness Psych: Normal affect   Labs    High Sensitivity Troponin:  No results for input(s): TROPONINIHS in the last 720 hours.    Chemistry Recent Labs  Lab 06/07/20 1900 06/08/20 0330 06/09/20 0712  NA 133* 135 136  K 3.7 4.0 3.8  CL 100 103 103  CO2 20* 21* 24  GLUCOSE 125* 115* 98  BUN 9 5* 9  CREATININE 0.68 0.68 0.81  CALCIUM 8.4* 8.9 9.4  PROT 7.0  --   --   ALBUMIN 3.9  --   --   AST 69*  --   --   ALT 59*  --   --   ALKPHOS 59  --   --   BILITOT 0.7  --   --   GFRNONAA >60 >60 >60  ANIONGAP Hematology Recent Labs  Lab 06/08/20 0330 06/08/20 1246 06/09/20 0712  WBC 6.7 6.1 5.3  RBC 5.79 5.89* 6.19*  HGB 17.5* 17.7* 18.6*  HCT  50.9 51.8 56.1*  MCV 87.9 87.9 90.6  MCH 30.2 30.1 30.0  MCHC 34.4 34.2 33.2  RDW 12.8 12.8 12.8  PLT 153 159 129*    BNPNo results for input(s): BNP, PROBNP in the last 168 hours.   DDimer No results for input(s): DDIMER in the last 168 hours.   Radiology    CT ANGIO HEAD W OR WO CONTRAST  Result Date: 06/07/2020 CLINICAL DATA:  Stroke/TIA, assess extracranial arteries EXAM: CT ANGIOGRAPHY HEAD AND NECK TECHNIQUE: Multidetector CT imaging of the head and neck was performed using the standard protocol during bolus administration of intravenous contrast. Multiplanar CT image reconstructions and MIPs were obtained to evaluate the vascular anatomy. Carotid stenosis measurements (when applicable) are obtained utilizing NASCET criteria, using the distal internal carotid diameter as the denominator. CONTRAST:  15mL OMNIPAQUE IOHEXOL 350 MG/ML SOLN COMPARISON:  None. FINDINGS: CT HEAD FINDINGS Brain: There is  no mass, hemorrhage or extra-axial collection. The size and configuration of the ventricles and extra-axial CSF spaces are normal. There is no acute or chronic infarction. The brain parenchyma is normal. Skull: The visualized skull base, calvarium and extracranial soft tissues are normal. Sinuses/Orbits: No fluid levels or advanced mucosal thickening of the visualized paranasal sinuses. No mastoid or middle ear effusion. The orbits are normal. CTA NECK FINDINGS SKELETON: There is no bony spinal canal stenosis. No lytic or blastic lesion. OTHER NECK: Normal pharynx, larynx and major salivary glands. No cervical lymphadenopathy. Unremarkable thyroid gland. UPPER CHEST: No pneumothorax or pleural effusion. No nodules or masses. AORTIC ARCH: There is no calcific atherosclerosis of the aortic arch. There is no aneurysm, dissection or hemodynamically significant stenosis of the visualized portion of the aorta. Conventional 3 vessel aortic branching pattern. The visualized proximal subclavian arteries are widely patent. RIGHT CAROTID SYSTEM: Normal without aneurysm, dissection or stenosis. LEFT CAROTID SYSTEM: Normal without aneurysm, dissection or stenosis. VERTEBRAL ARTERIES: Left dominant configuration. Both origins are clearly patent. There is no dissection, occlusion or flow-limiting stenosis to the skull base (V1-V3 segments). CTA HEAD FINDINGS POSTERIOR CIRCULATION: --Vertebral arteries: Normal V4 segments. --Inferior cerebellar arteries: Normal. --Basilar artery: Normal. --Superior cerebellar arteries: Normal. --Posterior cerebral arteries (PCA): Distal left P2 segment occlusion (14:23). Normal right. ANTERIOR CIRCULATION: --Intracranial internal carotid arteries: Normal. --Anterior cerebral arteries (ACA): Normal. Both A1 segments are present. Patent anterior communicating artery (a-comm). --Middle cerebral arteries (MCA): Normal. VENOUS SINUSES: As permitted by contrast timing, patent. ANATOMIC VARIANTS: None  Review of the MIP images confirms the above findings. IMPRESSION: 1. Distal left P2 segment occlusion. 2. No other intracranial arterial occlusion or high-grade stenosis. 3. Normal cervical carotid and vertebral arteries. Electronically Signed   By: Deatra Robinson M.D.   On: 06/07/2020 21:22   CT ANGIO NECK W OR WO CONTRAST  Result Date: 06/07/2020 CLINICAL DATA:  Stroke/TIA, assess extracranial arteries EXAM: CT ANGIOGRAPHY HEAD AND NECK TECHNIQUE: Multidetector CT imaging of the head and neck was performed using the standard protocol during bolus administration of intravenous contrast. Multiplanar CT image reconstructions and MIPs were obtained to evaluate the vascular anatomy. Carotid stenosis measurements (when applicable) are obtained utilizing NASCET criteria, using the distal internal carotid diameter as the denominator. CONTRAST:  15mL OMNIPAQUE IOHEXOL 350 MG/ML SOLN COMPARISON:  None. FINDINGS: CT HEAD FINDINGS Brain: There is no mass, hemorrhage or extra-axial collection. The size and configuration of the ventricles and extra-axial CSF spaces are normal. There is no acute or chronic infarction. The brain parenchyma is normal. Skull: The visualized skull base, calvarium  and extracranial soft tissues are normal. Sinuses/Orbits: No fluid levels or advanced mucosal thickening of the visualized paranasal sinuses. No mastoid or middle ear effusion. The orbits are normal. CTA NECK FINDINGS SKELETON: There is no bony spinal canal stenosis. No lytic or blastic lesion. OTHER NECK: Normal pharynx, larynx and major salivary glands. No cervical lymphadenopathy. Unremarkable thyroid gland. UPPER CHEST: No pneumothorax or pleural effusion. No nodules or masses. AORTIC ARCH: There is no calcific atherosclerosis of the aortic arch. There is no aneurysm, dissection or hemodynamically significant stenosis of the visualized portion of the aorta. Conventional 3 vessel aortic branching pattern. The visualized proximal  subclavian arteries are widely patent. RIGHT CAROTID SYSTEM: Normal without aneurysm, dissection or stenosis. LEFT CAROTID SYSTEM: Normal without aneurysm, dissection or stenosis. VERTEBRAL ARTERIES: Left dominant configuration. Both origins are clearly patent. There is no dissection, occlusion or flow-limiting stenosis to the skull base (V1-V3 segments). CTA HEAD FINDINGS POSTERIOR CIRCULATION: --Vertebral arteries: Normal V4 segments. --Inferior cerebellar arteries: Normal. --Basilar artery: Normal. --Superior cerebellar arteries: Normal. --Posterior cerebral arteries (PCA): Distal left P2 segment occlusion (14:23). Normal right. ANTERIOR CIRCULATION: --Intracranial internal carotid arteries: Normal. --Anterior cerebral arteries (ACA): Normal. Both A1 segments are present. Patent anterior communicating artery (a-comm). --Middle cerebral arteries (MCA): Normal. VENOUS SINUSES: As permitted by contrast timing, patent. ANATOMIC VARIANTS: None Review of the MIP images confirms the above findings. IMPRESSION: 1. Distal left P2 segment occlusion. 2. No other intracranial arterial occlusion or high-grade stenosis. 3. Normal cervical carotid and vertebral arteries. Electronically Signed   By: Deatra Robinson M.D.   On: 06/07/2020 21:22   MR BRAIN WO CONTRAST  Result Date: 06/08/2020 CLINICAL DATA:  Stroke follow-up EXAM: MRI HEAD WITHOUT CONTRAST TECHNIQUE: Multiplanar, multiecho pulse sequences of the brain and surrounding structures were obtained without intravenous contrast. COMPARISON:  06/07/2020 FINDINGS: Brain: Small area abnormal diffusion restriction in the left thalamus and medial left temporal lobe. Punctate foci of abnormal diffusion restriction in the left occipital lobe. No acute or chronic hemorrhage. Skull and upper cervical spine: Normal calvarium and skull base. Visualized upper cervical spine and soft tissues are normal. Sinuses/Orbits:No paranasal sinus fluid levels or advanced mucosal thickening. No  mastoid or middle ear effusion. Normal orbits. IMPRESSION: There are a few new punctate foci of acute ischemia in the left PCA territory, in addition to the unchanged left thalamic and medial temporal lobe infarcts Electronically Signed   By: Deatra Robinson M.D.   On: 06/08/2020 19:25   MR BRAIN WO CONTRAST  Result Date: 06/07/2020 CLINICAL DATA:  Right-sided weakness EXAM: MRI HEAD WITHOUT CONTRAST TECHNIQUE: Multiplanar, multiecho pulse sequences of the brain and surrounding structures were obtained without intravenous contrast. COMPARISON:  CTA head neck 06/07/2020 FINDINGS: Brain: There are areas of abnormal diffusion restriction in the left thalamus and medial temporal lobe. No acute or chronic hemorrhage. There is multifocal hyperintense T2-weighted signal within the white matter. Parenchymal volume and CSF spaces are normal. The midline structures are normal. Vascular: Major flow voids are preserved. Skull and upper cervical spine: Normal calvarium and skull base. Visualized upper cervical spine and soft tissues are normal. Sinuses/Orbits:No paranasal sinus fluid levels or advanced mucosal thickening. No mastoid or middle ear effusion. Normal orbits. IMPRESSION: Small acute/subacute infarcts of the left thalamus and medial temporal lobe, both within the left PCA territory. No hemorrhage or mass effect. Electronically Signed   By: Deatra Robinson M.D.   On: 06/07/2020 22:01   ECHOCARDIOGRAM COMPLETE  Result Date: 06/08/2020    ECHOCARDIOGRAM REPORT  Patient Name:   ABDALLA NARAMORE Date of Exam: 06/08/2020 Medical Rec #:  [id number]     Height:       72.0 in Accession #:    [id number]    Weight:       200.6 lb Date of Birth:  1976/01/20    BSA:          2.133 m Patient Age:    44 years      BP:           114/87 mmHg Patient Gender: M             HR:           95 bpm. Exam Location:  Inpatient Procedure: 2D Echo Indications:    Stroke; Atrial Fibrillation I48.91  History:        Patient has no prior history  of Echocardiogram examinations.  Sonographer:    Thurman Coyer RDCS (AE) Referring Phys: [id number] SRISHTI L BHAGAT IMPRESSIONS  1. Left ventricular ejection fraction, by estimation, is 30 to 35%. The left ventricle has moderately decreased function. The left ventricle demonstrates global hypokinesis. Left ventricular diastolic function could not be evaluated.  2. Right ventricular systolic function is moderately reduced. The right ventricular size is mildly enlarged. Tricuspid regurgitation signal is inadequate for assessing PA pressure.  3. Left atrial size was moderately dilated.  4. The mitral valve is normal in structure. Mild to moderate mitral valve regurgitation.  5. The aortic valve is normal in structure. Aortic valve regurgitation is not visualized.  6. The inferior vena cava is dilated in size with >50% respiratory variability, suggesting right atrial pressure of 8 mmHg. FINDINGS  Left Ventricle: Left ventricular ejection fraction, by estimation, is 30 to 35%. The left ventricle has moderately decreased function. The left ventricle demonstrates global hypokinesis. The left ventricular internal cavity size was normal in size. There is no left ventricular hypertrophy. Left ventricular diastolic function could not be evaluated due to atrial fibrillation. Left ventricular diastolic function could not be evaluated. Right Ventricle: The right ventricular size is mildly enlarged. No increase in right ventricular wall thickness. Right ventricular systolic function is moderately reduced. Tricuspid regurgitation signal is inadequate for assessing PA pressure. Left Atrium: Left atrial size was moderately dilated. Right Atrium: Right atrial size was normal in size. Pericardium: There is no evidence of pericardial effusion. Mitral Valve: The mitral valve is normal in structure. Mild to moderate mitral valve regurgitation, with centrally-directed jet. Tricuspid Valve: The tricuspid valve is normal in structure.  Tricuspid valve regurgitation is not demonstrated. Aortic Valve: The aortic valve is normal in structure. Aortic valve regurgitation is not visualized. Pulmonic Valve: The pulmonic valve was grossly normal. Pulmonic valve regurgitation is not visualized. Aorta: The aortic root and ascending aorta are structurally normal, with no evidence of dilitation. Venous: The inferior vena cava is dilated in size with greater than 50% respiratory variability, suggesting right atrial pressure of 8 mmHg. IAS/Shunts: No atrial level shunt detected by color flow Doppler.  LEFT VENTRICLE PLAX 2D LVIDd:         5.00 cm LVIDs:         4.10 cm LV PW:         1.00 cm LV IVS:        1.00 cm LVOT diam:     2.10 cm LV SV:         52 LV SV Index:   24 LVOT Area:     3.46 cm  RIGHT  VENTRICLE RV Basal diam:  4.40 cm RV S prime:     7.72 cm/s TAPSE (M-mode): 1.1 cm LEFT ATRIUM           Index       RIGHT ATRIUM           Index LA diam:      4.50 cm 2.11 cm/m  RA Area:     16.00 cm LA Vol (A2C): 60.3 ml 28.27 ml/m RA Volume:   41.20 ml  19.31 ml/m LA Vol (A4C): 60.4 ml 28.33 ml/m  AORTIC VALVE LVOT Vmax:   93.70 cm/s LVOT Vmean:  61.067 cm/s LVOT VTI:    0.149 m  AORTA Ao Root diam: 3.50 cm MR Peak grad: 77.4 mmHg MR Mean grad: 55.0 mmHg   SHUNTS MR Vmax:      440.00 cm/s Systemic VTI:  0.15 m MR Vmean:     357.0 cm/s  Systemic Diam: 2.10 cm Rachelle HoraMihai Croitoru MD Electronically signed by Thurmon FairMihai Croitoru MD Signature Date/Time: 06/08/2020/12:42:02 PM    Final    CT HEAD CODE STROKE WO CONTRAST  Result Date: 06/07/2020 CLINICAL DATA:  Code stroke. Acute neuro deficit. Slurred speech right facial droop EXAM: CT HEAD WITHOUT CONTRAST TECHNIQUE: Contiguous axial images were obtained from the base of the skull through the vertex without intravenous contrast. COMPARISON:  None. FINDINGS: Brain: No evidence of acute infarction, hemorrhage, hydrocephalus, extra-axial collection or mass lesion/mass effect. Vascular: Atherosclerotic calcification  distal vertebral arteries bilaterally. Negative for hyperdense vessel Skull: Negative Sinuses/Orbits: Negative Other: None ASPECTS (Alberta Stroke Program Early CT Score) - Ganglionic level infarction (caudate, lentiform nuclei, internal capsule, insula, M1-M3 cortex): 7 - Supraganglionic infarction (M4-M6 cortex): 3 Total score (0-10 with 10 being normal): 10 IMPRESSION: 1. No acute intracranial abnormality. 2. ASPECTS is 10 3. Code stroke imaging results were communicated on 06/07/2020 at 6:03 pm to provider Bhagat via text page Electronically Signed   By: Marlan Palauharles  Clark M.D.   On: 06/07/2020 18:04    Cardiac Studies   Echo on 06/08/20:  1. Left ventricular ejection fraction, by estimation, is 30 to 35%. The  left ventricle has moderately decreased function. The left ventricle  demonstrates global hypokinesis. Left ventricular diastolic function could  not be evaluated.  2. Right ventricular systolic function is moderately reduced. The right  ventricular size is mildly enlarged. Tricuspid regurgitation signal is  inadequate for assessing PA pressure.  3. Left atrial size was moderately dilated.  4. The mitral valve is normal in structure. Mild to moderate mitral valve  regurgitation.  5. The aortic valve is normal in structure. Aortic valve regurgitation is  not visualized.  6. The inferior vena cava is dilated in size with >50% respiratory  variability, suggesting right atrial pressure of 8 mmHg.  Patient Profile     Carlota Raspberryomas Gresham is a 45 y.o. male with no known PMH, ETOH abuse, tobacco abuse, who presented with acute right sided weakness and paresthesia, difficulty ambulation on 06/07/20. He was given TPA 06/07/2020. Cardiology is consulted for A fib with RVR and heart failure.    Assessment & Plan    Newly discovered A fib with RVR  - unknown onset and duration  - rate is not controlled with metoprolol 12.5mg  Q6H, discussed with neurology NP at bedside today, Ok with up-titrate  metoprolol  For rate control from permissive HTN standpoint, agree with increasing metoprolol 25mg  TID, plan transition to XL before discharge - UDS negative for cocaine, patient denied illicit drug use  -  CHA2DS2VAScscore is 3 due to CHF, CVA. Recommend start anticoagulation with Xarelto (to improve compliance with once daily dosing)  upon clearance from neuro (s/p TPA 06/07/20) - he has no medical insurance, SW to assist cost issue, he states he is able to afford some co-pay if needed    Newly discovered systolic and diastolic heart failure -Patient reports intermittent SOB and feeling irregular heartbeat over the past year  -Echocardiogram 4/19 showed EF 30 to 35%, LV global hypokinesis, diastolic function cannot be evaluated, RV systolic function moderately reduced, RV mildly enlarged, left atrium moderately dilated, mild to moderate MR - HIV negative  - 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, would repeat Echo in 4-6  weeks outpatient, if EF without improvement, left heart catheterization should be considered for ischemic workup  - monitor daily weight, intake and output, recommend low sodium diet  - recommend GDMT with initiation of metoprolol XL at DC, if BP tolerating and renal function stable, may consider add on Entresto, Jardiance and spironolactone at outpatient follow up  - patient is educated on low sodium diet, CHF signs, weight monitor, and fluid restriction   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 given A fib RVR , s/p TPA on 06/07/20, neurologically recovered  - Recommend anticoagulation for embolic CVA prophylaxis when OK with Neuro team - Neuro recommend Aspirin 325mg  and statin currently  Hyperlipidemia - LDL 149,  statin started per neuro  Alcohol abuse -Patient drinks 5-6 beers daily, endorsed withdrawal symptoms at home -Monitor for DT, CIWA protocol -Recommend long-term folic acid and thiamine supplement -Discussed alcohol  moderation  Smoking -Discussed smoking cessation   Follow up appointment with cardiology has been arranged on 06/25/20 at [facility] location per patient preference.     For questions or updates, please contact [facility] Please consult [email or web address] for contact info under    Signed, [facility], NP  06/09/2020, 10:00 AM    History and all data above reviewed.  Patient examined.  I agree with the findings as above.  Denies pain or SOB.  Interpreter services used.  The patient exam reveals 06/11/2020   ,  Lungs: clear  ,  Abd: Positive bowel sounds, no rebound no guarding, Ext No edema  .  All available labs, radiology testing, previous records reviewed. Agree with documented assessment and plan.   Atrial fib:  Change to metoprolol 25 mg qid and consolidate to XL tomorrow.  I will give one dose of PO dig today.  We will try to arrange Xarelto assistance.  I would like to start Entresto as well before he goes home but we might need an ARB if we cannot arrange med assistance.  Acute Systolic HF:  See previous    KFE:[id number] Paizleigh Wilds  11:59 AM  06/09/2020

## 2020-06-09 NOTE — Progress Notes (Signed)
ANTICOAGULATION CONSULT NOTE - Initial Consult  Pharmacy Consult for heparin Indication: pulmonary embolus  No Known Allergies  Patient Measurements: Weight: 88.8 kg (195 lb 12.3 oz) Heparin Dosing Weight: 88.8 kg   Vital Signs: Temp: 98.5 F (36.9 C) (04/20 2000) Temp Source: Oral (04/20 2000) BP: 124/100 (04/20 1600) Pulse Rate: 114 (04/20 1340)  Labs: Recent Labs    06/07/20 1900 06/08/20 0330 06/08/20 1246 06/09/20 0712 06/09/20 2041  HGB  --  17.5* 17.7* 18.6*  --   HCT  --  50.9 51.8 56.1*  --   PLT  --  153 159 129*  --   APTT 24  --   --   --   --   LABPROT 14.7  --   --   --   --   INR 1.2  --   --   --   --   HEPARINUNFRC  --   --   --   --  0.15*  CREATININE 0.68 0.68  --  0.81  --     CrCl cannot be calculated (Unknown ideal weight.).   Medical History: History reviewed. No pertinent past medical history.  Medications:  No medications prior to admission.    Assessment: 22 YOM with h/o of Afib who presented with R-sided deficits found to have a L P2 occlusion s/p tPA. Pharmacy consulted to start IV heparin for AFib and secondary stroke prevention.   H/H high, Plt low, SCr wnl   Heparin level subtherapeutic at 0.15.   Goal of Therapy:  Heparin level 0.3-0.5 units/ml Monitor platelets by anticoagulation protocol: Yes   Plan:  -Increase heparin 1350 units/hr. No bolus  -F/u 6 hr HL -Monitor daily HL, CBC and s/s of bleeding   Jeanella Cara, PharmD, [facility] Clinical Pharmacist Please see AMION for all Pharmacists' Contact Phone Numbers 06/09/2020, 9:30 PM

## 2020-06-10 LAB — CBC
HCT: 57 % — ABNORMAL HIGH (ref 39.0–52.0)
Hemoglobin: 18.9 g/dL — ABNORMAL HIGH (ref 13.0–17.0)
MCH: 30.2 pg (ref 26.0–34.0)
MCHC: 33.2 g/dL (ref 30.0–36.0)
MCV: 91.1 fL (ref 80.0–100.0)
Platelets: 136 10*3/uL — ABNORMAL LOW (ref 150–400)
RBC: 6.26 MIL/uL — ABNORMAL HIGH (ref 4.22–5.81)
RDW: 12.5 % (ref 11.5–15.5)
WBC: 7.1 10*3/uL (ref 4.0–10.5)
nRBC: 0 % (ref 0.0–0.2)

## 2020-06-10 LAB — HEPARIN LEVEL (UNFRACTIONATED)
Heparin Unfractionated: 0.31 IU/mL (ref 0.30–0.70)
Heparin Unfractionated: 0.33 IU/mL (ref 0.30–0.70)

## 2020-06-10 LAB — BASIC METABOLIC PANEL
Anion gap: 9 (ref 5–15)
BUN: 15 mg/dL (ref 6–20)
CO2: 23 mmol/L (ref 22–32)
Calcium: 9.4 mg/dL (ref 8.9–10.3)
Chloride: 103 mmol/L (ref 98–111)
Creatinine, Ser: 1.13 mg/dL (ref 0.61–1.24)
GFR, Estimated: >60 mL/min (ref >60)
Glucose, Bld: 96 mg/dL (ref 70–99)
Potassium: 4.2 mmol/L (ref 3.5–5.1)
Sodium: 135 mmol/L (ref 135–145)

## 2020-06-10 MED ORDER — RIVAROXABAN 20 MG PO TABS
20.0000 mg | ORAL_TABLET | Freq: Every day | ORAL | Status: DC
  Administered 2020-06-10 – 2020-06-11 (×2): 20 mg via ORAL
  Filled 2020-06-10 (×3): qty 1

## 2020-06-10 MED ORDER — METOPROLOL TARTRATE 50 MG PO TABS
50.0000 mg | ORAL_TABLET | Freq: Four times a day (QID) | ORAL | Status: DC
  Administered 2020-06-10 – 2020-06-11 (×4): 50 mg via ORAL
  Filled 2020-06-10 (×5): qty 1

## 2020-06-10 MED ORDER — SENNOSIDES-DOCUSATE SODIUM 8.6-50 MG PO TABS
1.0000 | ORAL_TABLET | Freq: Two times a day (BID) | ORAL | Status: DC
  Administered 2020-06-10 – 2020-06-11 (×4): 1 via ORAL
  Filled 2020-06-10 (×6): qty 1

## 2020-06-10 NOTE — Progress Notes (Signed)
SLP Cancellation Note  Patient Details Name: Jason Gibbs MRN: [id number] DOB: [birth year]   Cancelled treatment:       Reason Eval/Treat Not Completed: SLP screened, no needs identified, will sign off; Pt denies changes with cognitive linguistics.    Ardyth Gal MA, CCC-SLP Acute Rehabilitation Services   06/10/2020, 10:44 AM

## 2020-06-10 NOTE — Progress Notes (Signed)
Spoke with Marisue Ivan, SW, to ensure patient is able to get Entresto and Xarelto at DC per Dr. Jenene Slicker request. They will be able to supply 30 day supply at DC and will also help the patient get set up with [facility]. Marisue Ivan will also let primary team know who will be directing discharge when patient goes home.

## 2020-06-10 NOTE — Progress Notes (Addendum)
STROKE TEAM PROGRESS NOTE   SUBJECTIVE (INTERVAL HISTORY)  Patient remained in atrial fibrillation with RVR.  Heart rate in the 120s overnight.  On heparin infusion.  Will switch to Xarelto today.  Metoprolol increased to 50mg  q 6 hrs.  The patient appears dehydrated with concentrated urine.  We encouraged the patient to drink more water today.  Transfer out of the ICU today once heart rate is better controlled.     OBJECTIVE Temp:  [97.6 F (36.4 C)-99.6 F (37.6 C)] 97.6 F (36.4 C) (04/21 1600) Pulse Rate:  [107-120] 107 (04/21 1000) Cardiac Rhythm: Atrial fibrillation (04/21 0800) Resp:  [15-23] 18 (04/21 1700) BP: (106-158)/(62-130) 129/92 (04/21 1700)  Recent Labs  Lab 06/07/20 1748  GLUCAP 126*   Recent Labs  Lab 06/07/20 1900 06/08/20 0330 06/08/20 1246 06/09/20 0712 06/10/20 0451  NA 133* 135  --  136 135  K 3.7 4.0  --  3.8 4.2  CL 100 103  --  103 103  CO2 20* 21*  --  24 23  GLUCOSE 125* 115*  --  98 96  BUN 9 5*  --  9 15  CREATININE 0.68 0.68  --  0.81 1.13  CALCIUM 8.4* 8.9  --  9.4 9.4  MG 2.1  --  2.0  --   --   PHOS  --   --  2.8  --   --    Recent Labs  Lab 06/07/20 1900  AST 69*  ALT 59*  ALKPHOS 59  BILITOT 0.7  PROT 7.0  ALBUMIN 3.9   Recent Labs  Lab 06/07/20 1747 06/08/20 0330 06/08/20 1246 06/09/20 0712 06/10/20 0451  WBC 5.6 6.7 6.1 5.3 7.1  NEUTROABS 3.0  --   --   --   --   HGB 16.1 17.5* 17.7* 18.6* 18.9*  HCT 47.7 50.9 51.8 56.1* 57.0*  MCV 89.3 87.9 87.9 90.6 91.1  PLT 142* 153 159 129* 136*   No results for input(s): CKTOTAL, CKMB, CKMBINDEX, TROPONINI in the last 168 hours. Recent Labs    06/07/20 1900  LABPROT 14.7  INR 1.2   No results for input(s): COLORURINE, LABSPEC, PHURINE, GLUCOSEU, HGBUR, BILIRUBINUR, KETONESUR, PROTEINUR, UROBILINOGEN, NITRITE, LEUKOCYTESUR in the last 72 hours.  Invalid input(s): APPERANCEUR     Component Value Date/Time   CHOL 214 (H) 06/08/2020 0330   TRIG 104 06/08/2020 0330    HDL 44 06/08/2020 0330   CHOLHDL 4.9 06/08/2020 0330   VLDL 21 06/08/2020 0330   LDLCALC 149 (H) 06/08/2020 0330   Lab Results  Component Value Date   HGBA1C 5.6 06/08/2020      Component Value Date/Time   LABOPIA NONE DETECTED 06/08/2020 1224   COCAINSCRNUR NONE DETECTED 06/08/2020 1224   LABBENZ NONE DETECTED 06/08/2020 1224   AMPHETMU NONE DETECTED 06/08/2020 1224   THCU NONE DETECTED 06/08/2020 1224   LABBARB NONE DETECTED 06/08/2020 1224    Recent Labs  Lab 06/07/20 2227  ETH 71*    I have personally reviewed the radiological images below and agree with the radiology interpretations.  CT ANGIO HEAD W OR WO CONTRAST  Result Date: 06/07/2020 IMPRESSION: 1. Distal left P2 segment occlusion. 2. No other intracranial arterial occlusion or high-grade stenosis. 3. Normal cervical carotid and vertebral arteries. Electronically Signed   By: 06/09/2020 M.D.   On: 06/07/2020 21:22   CT ANGIO NECK W OR WO CONTRAST  Result Date: 06/07/2020 IMPRESSION: 1. Distal left P2 segment occlusion. 2. No other intracranial  arterial occlusion or high-grade stenosis. 3. Normal cervical carotid and vertebral arteries. Electronically Signed   By: Deatra Robinson M.D.   On: 06/07/2020 21:22   MR BRAIN WO CONTRAST  Result Date: 06/08/2020  IMPRESSION: There are a few new punctate foci of acute ischemia in the left PCA territory, in addition to the unchanged left thalamic and medial temporal lobe infarcts Electronically Signed   By: Deatra Robinson M.D.   On: 06/08/2020 19:25   MR BRAIN WO CONTRAST  Result Date: 06/07/2020 IMPRESSION: Small acute/subacute infarcts of the left thalamus and medial temporal lobe, both within the left PCA territory. No hemorrhage or mass effect. Electronically Signed   By: Deatra Robinson M.D.   On: 06/07/2020 22:01   ECHOCARDIOGRAM COMPLETE  Result Date: 06/08/2020 IMPRESSIONS   1. Left ventricular ejection fraction, by estimation, is 30 to 35%. The left ventricle has  moderately decreased function. The left ventricle demonstrates global hypokinesis. Left ventricular diastolic function could not be evaluated.   2. Right ventricular systolic function is moderately reduced. The right ventricular size is mildly enlarged. Tricuspid regurgitation signal is inadequate for assessing PA pressure.   3. Left atrial size was moderately dilated.   4. The mitral valve is normal in structure. Mild to moderate mitral valve regurgitation.   5. The aortic valve is normal in structure. Aortic valve regurgitation is not visualized.   6. The inferior vena cava is dilated in size with >50% respiratory variability, suggesting right atrial pressure of 8 mmHg.  CT HEAD CODE STROKE WO CONTRAST Result Date: 06/07/2020 IMPRESSION: 1. No acute intracranial abnormality. 2. ASPECTS is 10 3. Code stroke imaging results were communicated on 06/07/2020 at 6:03 pm to provider Bhagat via text page Electronically Signed   By: Marlan Palau M.D.   On: 06/07/2020 18:04    PHYSICAL EXAM  Temp:  [97.6 F (36.4 C)-99.6 F (37.6 C)] 97.6 F (36.4 C) (04/21 1600) Pulse Rate:  [107-120] 107 (04/21 1000) Resp:  [15-23] 18 (04/21 1700) BP: (106-158)/(62-130) 129/92 (04/21 1700)  General - Well nourished, well developed, in no apparent distress.  Ophthalmologic - fundi not visualized due to noncooperation.  Cardiovascular - irregularly irregular heart rate and rhythm.  Neuro - awake, alert, oriented x4.  No aphasia, no dysarthria, paucity of speech, following all simple commands. Able to name and repeat. No gaze palsy, tracking bilaterally, visual field exam showed right upper quadrantanopia, right lower quadrant decreased visual acuity. PERRL. No facial droop. Tongue midline. Bilateral UEs 5/5, no drift. Bilaterally LEs 5/5, no drift. Sensation symmetrical bilaterally, b/l FTN and HTS intact, gait not tested.   ASSESSMENT/PLAN Mr. Jason Gibbs is a 45 y.o. male with history of smoker and alcohol  abuse admitted for right-sided weakness, right side numbness and difficulty walking. tPA given.  Found to have new diagnosed A. fib with RVR.  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 now transition to Xarelto 20mg  daily.   Ongoing aggressive stroke risk factor management  Therapy recommendations: Cleared without needs  Disposition: Home   A. fib RVR  Newly diagnosed  Cardiology on board: recommend Xarelto  Continue digoxin 0.25mg  daily  Increase metoprolol to 50 mg every 6 hours  Toprol 200 mg once daily at discharge  Financial considerations for discharge [facility] plan are being investigated  Acute Systolic HF  EF 30 to 35%  Cardiology on board  Currently on metoprolol  Start Entresto at discharge  Hypertension  Stable  Long term BP goal normotensive  Hyperlipidemia  Home meds: None  LDL 149, goal < 70  Now on Lipitor 40  Continue statin at discharge  Tobacco abuse  Current smoker  Smoking cessation counseling provided  Pt is willing to quit  On nicotine patch  Alcohol abuse  5-6 beers per day per patient  Not on CIWA protocol  FA/MVI/B1  Alcohol limitation education provided  Other Active Problems  Acute kidney injury: likely due to dehydration, creatinine 0.81->1.13, encourage po intake  Polycythemia - likely secondary to smoking  Ivelisse Olivencia-Simmons,  NP-C  ATTENDING NOTE: I reviewed above note and agree with the assessment and plan. Pt was seen and examined.   RN at bedside, patient awake alert, sitting in bed, flat affect, paucity of speech.  Did not voice any discomfort.  Neuro  exam unchanged still has right upper quadrantanopsia, otherwise intact.  Low p.o. intake, which caused creatinine increase from 0.81-1.13.  Encourage p.o. intake.  Still has A. fib RVR.  Cardiology on board, increase metoprolol to 50 mg every 6 hours.  Recommend metoprolol 200 mg on discharge.  Transitioned from heparin IV to Xarelto 20.  Continue digoxin.  For detailed assessment and plan, please refer to above as I have made changes wherever appropriate.   Marvel Plan, MD PhD Stroke Neurology 06/10/2020 7:28 PM  This patient is critically ill due to A. fib RVR, left PCA infarct, cardiomyopathy and at significant risk of neurological worsening, death form heart failure, hemorrhagic conversion, seizure. This patient's care requires constant monitoring of vital signs, hemodynamics, respiratory and cardiac monitoring, review of multiple databases, neurological assessment, discussion with family, other specialists and medical decision making of high complexity. I spent 35 minutes of neurocritical care time in the care of this patient.  I discussed with Dr. Merrily Pew CCM and Dr. Pamella Pert and cardiology.   To contact Stroke Continuity provider, please refer to [email or web address]. After hours, contact General Neurology

## 2020-06-10 NOTE — Discharge Instructions (Addendum)
Information on my medicine - ELIQUIS (apixaban)  This medication education was reviewed with me or my healthcare representative as part of my discharge preparation.    Why was Eliquis prescribed for you? Eliquis was prescribed for you to reduce the risk of forming blood clots that can cause a stroke if you have a medical condition called atrial fibrillation (a type of irregular heartbeat) OR to reduce the risk of a blood clots forming after orthopedic surgery.  What do You need to know about Eliquis ? Take your Eliquis TWICE DAILY - one tablet in the morning and one tablet in the evening with or without food.  It would be best to take the doses about the same time each day.  If you have difficulty swallowing the tablet whole please discuss with your pharmacist how to take the medication safely.  Take Eliquis exactly as prescribed by your doctor and DO NOT stop taking Eliquis without talking to the doctor who prescribed the medication.  Stopping may increase your risk of developing a new clot or stroke.  Refill your prescription before you run out.  After discharge, you should have regular check-up appointments with your healthcare provider that is prescribing your Eliquis.  In the future your dose may need to be changed if your kidney function or weight changes by a significant amount or as you get older.  What do you do if you miss a dose? If you miss a dose, take it as soon as you remember on the same day and resume taking twice daily.  Do not take more than one dose of ELIQUIS at the same time.  Important Safety Information A possible side effect of Eliquis is bleeding. You should call your healthcare provider right away if you experience any of the following: ? Bleeding from an injury or your nose that does not stop. ? Unusual colored urine (red or dark brown) or unusual colored stools (red or black). ? Unusual bruising for unknown reasons. ? A serious fall or if you hit your  head (even if there is no bleeding).  Some medicines may interact with Eliquis and might increase your risk of bleeding or clotting while on Eliquis. To help avoid this, consult your healthcare provider or pharmacist prior to using any new prescription or non-prescription medications, including herbals, vitamins, non-steroidal anti-inflammatory drugs (NSAIDs) and supplements.  This website has more information on Eliquis (apixaban): [email or web address].       Fibrilacin auricular Atrial Fibrillation  La fibrilacin auricular es un tipo de latido cardaco irregular o rpido. Si sufre esta afeccin, su corazn late sin ningn orden. Esto dificulta el bombeo de la sangre por parte del corazn de [place] normal. La fibrilacin auricular puede aparecer y Geneticist, molecular, o puede convertirse en un problema prolongado. Si esta afeccin no se trata, puede aumentar el riesgo de accidente cerebrovascular, insuficiencia cardaca y otros problemas del corazn. Cules son las causas? Esta afeccin ser causada por enfermedades que daan el corazn. Incluyen las siguientes:  Presin arterial alta.  Insuficiencia cardaca.  Enfermedad de las vlvulas cardacas.  Ciruga cardaca. Otras causas son las siguientes:  Diabetes.  Enfermedad tiroidea.  Tener sobrepeso.  Enfermedad renal. Algunas veces, la causa no se conoce. Qu incrementa el riesgo? Es ms probable que sufra esta afeccin si:  Es Neomia Dear persona de edad avanzada.  Fuma.  Hace ejercicio muy extenuante con frecuencia.  Tiene antecedentes familiares de esta afeccin.  Es hombre.  Consume drogas.  Bebe mucho alcohol.  Tiene afecciones pulmonares,  como enfisema, neumona o EPOC.  Tiene apnea del sueo. Cules son los signos o sntomas? Los sntomas frecuentes de esta afeccin [employer] siguientes:  Sensacin de que el corazn late [place] rpido.  Dolor o International aid/development worker.  Falta de aire.  Sensacin repentina de  debilidad o de desvanecimiento.  Cansarse con facilidad al hacer actividad fsica.  Desmayos.  Sudoracin. En algunos casos, no hay sntomas. Cmo se trata? El tratamiento depende de las afecciones subyacentes y de cmo se siente cuando tiene fibrilacin auricular. Incluyen las siguientes:  Medicamentos para: ? Prevenir los cogulos de [place]. ? Tratar los problemas de frecuencia cardaca o de ritmo cardaco.  Usar dispositivos, por ejemplo, un marcapasos, para corregir problemas del ritmo cardaco.  Garnetta Buddy ciruga para extirpar la parte del corazn que enva seales incorrectas.  Cerrar una zona donde se pueden formar cogulos en el corazn (orejuela auricular izquierda). En algunos casos, el mdico tratar otras afecciones subyacentes. Siga estas instrucciones en su casa: Medicamentos  Use los medicamentos de venta libre y los recetados solamente como se lo haya indicado el mdico.  No tome ningn medicamento nuevo sin hablar primero con su mdico.  Si est tomando anticoagulantes, tenga en cuenta lo siguiente: ? Hable con el mdico antes de tomar cualquier medicamento que contenga aspirina o antiinflamatorios no esteroideos (AINE), como el ibuprofeno. ? Tome los medicamentos exactamente como le indic el mdico. Tmelos a la [employer]. ? Evite las actividades que podran causarle lesiones o moretones. Siga las instrucciones acerca de cmo evitar las cadas. ? Use un brazalete que indique que est tomando anticoagulantes. O lleve consigo una tarjeta que [employer] medicamentos que est tomando. Estilo de vida  No consuma ningn producto que contenga nicotina o tabaco. Estos incluyen cigarrillos, cigarrillos electrnicos y tabaco para Theatre manager. Si necesita ayuda para dejar de fumar, consulte al mdico.  Consuma alimentos cardiosaludables. Hable con su mdico sobre el plan de alimentacin adecuado para usted.  Haga ejercicios con regularidad tal como se lo  indic el mdico.  No beba alcohol.  Baje de peso si es necesario.  No consuma drogas, ni siquiera cannabis.      Instrucciones generales  Si tiene una afeccin que hace que se detenga la respiracin por breves perodos (apnea), trtela como le indique su mdico.  Mantenga un peso saludable. No use pldoras para bajar de peso salvo que su mdico le diga que son seguras. Estas pastillas pueden agravar los problemas cardacos.  Concurra a todas las visitas de [address] se lo haya indicado el mdico. Esto es importante. Comunquese con un mdico si:  Nota un cambio en la velocidad, el ritmo o la fuerza de los latidos cardacos.  Toma medicamentos anticoagulantes y tiene ms moretones.  Se cansa con ms facilidad cuando se mueve o hace ejercicio.  Tiene un cambio repentino [employer] Solicite ayuda de inmediato si:  Siente dolor en el pecho o en la barriga (abdomen).  Tiene dificultad para respirar.  Tiene efectos secundarios de los anticoagulantes, como sangre en el vmito, la materia fecal (heces) o el pis (orina), o sangrado que no puede detenerse.  Tiene algn signo de accidente cerebrovascular. "BE FAST" es una manera fcil de recordar las principales seales de advertencia: ? B - Balance (equilibrio). Los signos son mareos, dificultad repentina para caminar o prdida del equilibrio. ? E - Eyes (ojos). Los signos son dificultad para ver o un cambio en la visin. ? F - Face (  rostro). Los signos son debilidad repentina o prdida de la sensibilidad en la cara, o que la cara o el prpado se caigan hacia un lado. ? A - Arms (brazos). Los signos son debilidad o prdida de la sensibilidad en un brazo. Esto sucede de repente y generalmente en un lado del cuerpo. ? S - Speech (habla). Los signos son dificultad para hablar, hablar arrastrando las palabras o dificultad para comprender lo que la gente dice. ? T - Time (tiempo). Es tiempo de llamar al servicio de Sports administrator. Anote  la hora a la que [employer] sntomas.  Presenta otros signos de un accidente cerebrovascular, como los siguientes: ? Dolor de [place] repentino y muy intenso sin causa aparente. ? Ganas de vomitar (nuseas). ? Vmitos. ? Una convulsin. Estos sntomas pueden Customer service manager. No espere a ver si los sntomas desaparecen. Solicite atencin mdica de inmediato. Comunquese con el servicio de emergencias de su localidad (911 en los [place]). No conduzca por sus propios medios [employer].   Resumen  La fibrilacin auricular es un tipo de latido cardaco irregular o rpido.  Est expuesto a un riesgo mayor de sufrir esta afeccin si fuma, es una persona de edad avanzada, tiene diabetes o tiene sobrepeso.  Siga las instrucciones de su mdico [employer], la dieta, el ejercicio y las visitas de seguimiento.  Obtenga ayuda de inmediato si tiene signos o sntomas de un accidente cerebrovascular.  Busque ayuda de inmediato si no puede respirar o siente dolor o   Fibrilacin auricular Atrial Fibrillation  La fibrilacin auricular es un tipo de latido cardaco irregular o rpido. Si sufre esta afeccin, su corazn late sin ningn orden. Esto dificulta el bombeo de la sangre por parte del corazn de [place] normal. La fibrilacin auricular puede aparecer y Geneticist, molecular, o puede convertirse en un problema prolongado. Si esta afeccin no se trata, puede aumentar el riesgo de accidente cerebrovascular, insuficiencia cardaca y otros problemas del corazn. Cules son las causas? Esta afeccin ser causada por enfermedades que daan el corazn. Incluyen las siguientes: Presin arterial alta. Insuficiencia cardaca. Enfermedad de las vlvulas cardacas. Ciruga cardaca. Otras causas son las siguientes: Diabetes. Enfermedad tiroidea. Tener sobrepeso. Enfermedad renal. Algunas veces, la causa no se conoce. Qu incrementa el riesgo? Es ms probable que sufra esta  afeccin si: Es Neomia Dear persona de edad avanzada. Fuma. Hace ejercicio muy extenuante con frecuencia. Tiene antecedentes familiares de esta afeccin. Es hombre. Consume drogas. Bebe mucho alcohol. Tiene afecciones pulmonares, como enfisema, neumona o EPOC. Tiene apnea del sueo. Cules son los signos o sntomas? Los sntomas frecuentes de esta afeccin [employer] siguientes: Sensacin de que el corazn late [place] rpido. Dolor o International aid/development worker. Falta de aire. Sensacin repentina de debilidad o de desvanecimiento. Cansarse con facilidad al hacer actividad fsica. Desmayos. Sudoracin. En algunos casos, no hay sntomas. Cmo se trata? El tratamiento depende de las afecciones subyacentes y de cmo se siente cuando tiene fibrilacin auricular. Incluyen las siguientes: Medicamentos para: Chief of Staff de [place]. Tratar los problemas de frecuencia cardaca o de ritmo cardaco. Usar dispositivos, por ejemplo, un marcapasos, para corregir problemas del ritmo cardaco. Garnetta Buddy ciruga para extirpar la parte del corazn que enva seales incorrectas. Cerrar una zona donde se pueden formar cogulos en el corazn (orejuela auricular izquierda). En algunos casos, el mdico tratar otras afecciones subyacentes. Siga estas instrucciones en su casa: Medicamentos Use los medicamentos de venta libre y los recetados solamente como se lo haya indicado  el mdico. No tome ningn medicamento nuevo sin hablar primero con su mdico. Si est tomando anticoagulantes, tenga en cuenta lo siguiente: Hable con el mdico antes de tomar cualquier medicamento que contenga aspirina o antiinflamatorios no esteroideos (AINE), como el ibuprofeno. Tome los medicamentos exactamente como le indic el mdico. Tmelos a la [employer]. Evite las actividades que podran causarle lesiones o moretones. Siga las instrucciones acerca de cmo evitar las cadas. Use un brazalete que indique que  est tomando anticoagulantes. O lleve consigo una tarjeta que [employer] medicamentos que est tomando. Estilo de vida No consuma ningn producto que contenga nicotina o tabaco. Estos incluyen cigarrillos, cigarrillos electrnicos y tabaco para Theatre manager. Si necesita ayuda para dejar de fumar, consulte al mdico. Consuma alimentos cardiosaludables. Hable con su mdico sobre el plan de alimentacin adecuado para usted. Haga ejercicios con regularidad tal como se lo indic el mdico. No beba alcohol. Baje de peso si es necesario. No consuma drogas, ni siquiera cannabis.      Instrucciones generales Si tiene una afeccin que hace que se detenga la respiracin por breves perodos (apnea), trtela como le indique su mdico. Mantenga un peso saludable. No use pldoras para bajar de peso salvo que su mdico le diga que son seguras. Estas pastillas pueden agravar los problemas cardacos. Concurra a todas las visitas de [address] se lo haya indicado el mdico. Esto es importante. Comunquese con un mdico si: Nota un cambio en la velocidad, el ritmo o la fuerza de los latidos cardacos. Toma medicamentos anticoagulantes y tiene ms moretones. Se cansa con ms facilidad cuando se mueve o hace ejercicio. Tiene un cambio repentino [employer] Solicite ayuda de inmediato si: Siente dolor en el pecho o en la barriga (abdomen). Tiene dificultad para respirar. Tiene efectos secundarios de los anticoagulantes, como sangre en el vmito, la materia fecal (heces) o el pis (orina), o sangrado que no puede detenerse. Tiene algn signo de accidente cerebrovascular. "BE FAST" es una manera fcil de recordar las principales seales de advertencia: B - Balance (equilibrio). Los signos son mareos, dificultad repentina para caminar o prdida del equilibrio. E - Eyes (ojos). Los signos son dificultad para ver o un cambio en la visin. F - Face (rostro). Los signos son debilidad repentina o prdida de la  sensibilidad en la cara, o que la cara o el prpado se caigan hacia un lado. A - Arms (brazos). Los signos son debilidad o prdida de la sensibilidad en un brazo. Esto sucede de repente y generalmente en un lado del cuerpo. S - Speech (habla). Los signos son dificultad para hablar, hablar arrastrando las palabras o dificultad para comprender lo que la gente dice. T - Time (tiempo). Es tiempo de llamar al servicio de Sports administrator. Anote la hora a la que [employer] sntomas. Presenta otros signos de un accidente cerebrovascular, como los siguientes: Dolor de [place] repentino y muy intenso sin causa aparente. Ganas de vomitar (nuseas). Vmitos. Una convulsin. Estos sntomas pueden Customer service manager. No espere a ver si los sntomas desaparecen. Solicite atencin mdica de inmediato. Comunquese con el servicio de emergencias de su localidad (911 en los [place]). No conduzca por sus propios medios [employer].   Resumen La fibrilacin auricular es un tipo de latido cardaco irregular o rpido. Est expuesto a un riesgo mayor de sufrir esta afeccin si fuma, es una persona de edad avanzada, tiene diabetes o tiene sobrepeso. Siga las instrucciones de su mdico [employer],  la dieta, el ejercicio y las visitas de seguimiento. Obtenga ayuda de inmediato si tiene signos o sntomas de un accidente cerebrovascular. Busque ayuda de inmediato si no puede respirar o siente dolor o Dentistmalestar en el pecho. Esta informacin no tiene Theme park managercomo fin reemplazar el consejo del mdico. Asegrese de hacerle al mdico cualquier pregunta que tenga. Document Revised: 09/24/2018 Document Reviewed: 09/24/2018 Elsevier Patient Education  2021 Elsevier Inc.  Citigroupmalestar en el pecho. Esta informacin no tiene Theme park managercomo fin reemplazar el consejo del mdico. Asegrese de hacerle al mdico cualquier pregunta que tenga. Document Revised: 09/24/2018 Document Reviewed: 09/24/2018 Elsevier Patient Education   2021 Elsevier Inc. Fibrilacin auricular Atrial Fibrillation  La fibrilacin auricular es un tipo de latido cardaco irregular o rpido. Si sufre esta afeccin, su corazn late sin ningn orden. Esto dificulta el bombeo de la sangre por parte del corazn de [place] normal. La fibrilacin auricular puede aparecer y Geneticist, moleculardesaparecer, o puede convertirse en un problema prolongado. Si esta afeccin no se trata, puede aumentar el riesgo de accidente cerebrovascular, insuficiencia cardaca y otros problemas del corazn. Cules son las causas? Esta afeccin ser causada por enfermedades que daan el corazn. Incluyen las siguientes:  Presin arterial alta.  Insuficiencia cardaca.  Enfermedad de las vlvulas cardacas.  Ciruga cardaca. Otras causas son las siguientes:  Diabetes.  Enfermedad tiroidea.  Tener sobrepeso.  Enfermedad renal. Algunas veces, la causa no se conoce. Qu incrementa el riesgo? Es ms probable que sufra esta afeccin si:  Es Neomia Dearuna persona de edad avanzada.  Fuma.  Hace ejercicio muy extenuante con frecuencia.  Tiene antecedentes familiares de esta afeccin.  Es hombre.  Consume drogas.  Bebe mucho alcohol.  Tiene afecciones pulmonares, como enfisema, neumona o EPOC.  Tiene apnea del sueo. Cules son los signos o sntomas? Los sntomas frecuentes de esta afeccin [employer] Internationalincluyen los siguientes:  Sensacin de que el corazn late [place] rpido.  Dolor o International aid/development workermolestias en el pecho.  Falta de aire.  Sensacin repentina de debilidad o de desvanecimiento.  Cansarse con facilidad al hacer actividad fsica.  Desmayos.  Sudoracin. En algunos casos, no hay sntomas. Cmo se trata? El tratamiento depende de las afecciones subyacentes y de cmo se siente cuando tiene fibrilacin auricular. Incluyen las siguientes:  Medicamentos para: ? Prevenir los cogulos de [place]. ? Tratar los problemas de frecuencia cardaca o de ritmo cardaco.  Usar dispositivos, por  ejemplo, un marcapasos, para corregir problemas del ritmo cardaco.  Garnetta Buddyealizar una ciruga para extirpar la parte del corazn que enva seales incorrectas.  Cerrar una zona donde se pueden formar cogulos en el corazn (orejuela auricular izquierda). En algunos casos, el mdico tratar otras afecciones subyacentes. Siga estas instrucciones en su casa: Medicamentos  Use los medicamentos de venta libre y los recetados solamente como se lo haya indicado el mdico.  No tome ningn medicamento nuevo sin hablar primero con su mdico.  Si est tomando anticoagulantes, tenga en cuenta lo siguiente: ? Hable con el mdico antes de tomar cualquier medicamento que contenga aspirina o antiinflamatorios no esteroideos (AINE), como el ibuprofeno. ? Tome los medicamentos exactamente como le indic el mdico. Tmelos a la Smith Internationalmisma hora todos los das. ? Evite las actividades que podran causarle lesiones o moretones. Siga las instrucciones acerca de cmo evitar las cadas. ? Use un brazalete que indique que est tomando anticoagulantes. O lleve consigo una tarjeta que W. R. Berkleyincluya los medicamentos que est tomando. Estilo de vida  No consuma ningn producto que contenga nicotina o tabaco. Estos incluyen cigarrillos, cigarrillos electrnicos  y tabaco para Theatre manager. Si necesita ayuda para dejar de fumar, consulte al mdico.  Consuma alimentos cardiosaludables. Hable con su mdico sobre el plan de alimentacin adecuado para usted.  Haga ejercicios con regularidad tal como se lo indic el mdico.  No beba alcohol.  Baje de peso si es necesario.  No consuma drogas, ni siquiera cannabis.      Instrucciones generales  Si tiene una afeccin que hace que se detenga la respiracin por breves perodos (apnea), trtela como le indique su mdico.  Mantenga un peso saludable. No use pldoras para bajar de peso salvo que su mdico le diga que son seguras. Estas pastillas pueden agravar los problemas  cardacos.  Concurra a todas las visitas de [address] se lo haya indicado el mdico. Esto es importante. Comunquese con un mdico si:  Nota un cambio en la velocidad, el ritmo o la fuerza de los latidos cardacos.  Toma medicamentos anticoagulantes y tiene ms moretones.  Se cansa con ms facilidad cuando se mueve o hace ejercicio.  Tiene un cambio repentino [employer] Solicite ayuda de inmediato si:  Siente dolor en el pecho o en la barriga (abdomen).  Tiene dificultad para respirar.  Tiene efectos secundarios de los anticoagulantes, como sangre en el vmito, la materia fecal (heces) o el pis (orina), o sangrado que no puede detenerse.  Tiene algn signo de accidente cerebrovascular. "BE FAST" es una manera fcil de recordar las principales seales de advertencia: ? B - Balance (equilibrio). Los signos son mareos, dificultad repentina para caminar o prdida del equilibrio. ? E - Eyes (ojos). Los signos son dificultad para ver o un cambio en la visin. ? F - Face (rostro). Los signos son debilidad repentina o prdida de la sensibilidad en la cara, o que la cara o el prpado se caigan hacia un lado. ? A - Arms (brazos). Los signos son debilidad o prdida de la sensibilidad en un brazo. Esto sucede de repente y generalmente en un lado del cuerpo. ? S - Speech (habla). Los signos son dificultad para hablar, hablar arrastrando las palabras o dificultad para comprender lo que la gente dice. ? T - Time (tiempo). Es tiempo de llamar al servicio de Sports administrator. Anote la hora a la que [employer] sntomas.  Presenta otros signos de un accidente cerebrovascular, como los siguientes: ? Dolor de [place] repentino y muy intenso sin causa aparente. ? Ganas de vomitar (nuseas). ? Vmitos. ? Una convulsin. Estos sntomas pueden Customer service manager. No espere a ver si los sntomas desaparecen. Solicite atencin mdica de inmediato. Comunquese con el servicio de emergencias de su  localidad (911 en los [place]). No conduzca por sus propios medios [employer].   Resumen  La fibrilacin auricular es un tipo de latido cardaco irregular o rpido.  Est expuesto a un riesgo mayor de sufrir esta afeccin si fuma, es una persona de edad avanzada, tiene diabetes o tiene sobrepeso.  Siga las instrucciones de su mdico [employer], la dieta, el ejercicio y las visitas de seguimiento.  Obtenga ayuda de inmediato si tiene signos o sntomas de un accidente cerebrovascular.  Busque ayuda de inmediato si no puede respirar o siente dolor o Dentist. Esta informacin no tiene Theme park manager el consejo del mdico. Asegrese de hacerle al mdico cualquier pregunta que tenga. Document Revised: 09/24/2018 Document Reviewed: 09/24/2018 Elsevier Patient Education  2021 [employer].

## 2020-06-10 NOTE — Progress Notes (Signed)
Progress Note  Patient Name: Jason Gibbs Date of Encounter: 06/10/2020  Primary Cardiologist:   No primary care provider on file.   Subjective   Interpreter services utilized.  No pain.  No SOB.   Inpatient Medications    Scheduled Meds: . atorvastatin  40 mg Oral Daily  . Chlorhexidine Gluconate Cloth  6 each Topical Daily  . digoxin  0.25 mg Oral Daily  . folic acid  1 mg Oral Daily  . metoprolol tartrate  50 mg Oral Q6H  . multivitamin with minerals  1 tablet Oral Daily  . nicotine  14 mg Transdermal Daily  . senna-docusate  1 tablet Oral BID  . thiamine  100 mg Oral Daily   Continuous Infusions: . heparin 1,350 Units/hr (06/10/20 0957)   PRN Meds: acetaminophen **OR** acetaminophen (TYLENOL) oral liquid 160 mg/5 mL **OR** acetaminophen, labetalol, LORazepam **OR** LORazepam   Vital Signs    Vitals:   06/10/20 0800 06/10/20 0900 06/10/20 0911 06/10/20 1000  BP: (!) 120/103 (!) 158/98 (!) 158/98 (!) 127/110  Pulse:   (!) 120 (!) 107  Resp: (!) 21 15    Temp: 99.5 F (37.5 C)     TempSrc: Oral     SpO2:      Weight:        Intake/Output Summary (Last 24 hours) at 06/10/2020 1007 Last data filed at 06/10/2020 0900 Gross per 24 hour  Intake 293.58 ml  Output 900 ml  Net -606.42 ml   Filed Weights   06/07/20 1700 06/09/20 0400  Weight: 91 kg 88.8 kg    Telemetry    Atrial fib with RVR - Personally Reviewed  ECG    NA - Personally Reviewed  Physical Exam   GEN: No acute distress.   Neck: No  JVD Cardiac: Irregular RR, no murmurs, rubs, or gallops.  Respiratory: Clear to auscultation bilaterally. GI: Soft, nontender, non-distended  MS: No edema; No deformity. Neuro:  Nonfocal  Psych: Normal affect   Labs    Chemistry Recent Labs  Lab 06/07/20 1900 06/08/20 0330 06/09/20 0712 06/10/20 0451  NA 133* 135 136 135  K 3.7 4.0 3.8 4.2  CL 100 103 103 103  CO2 20* 21* 24 23  GLUCOSE 125* 115* 98 96  BUN 9 5* 9 15  CREATININE 0.68 0.68  0.81 1.13  CALCIUM 8.4* 8.9 9.4 9.4  PROT 7.0  --   --   --   ALBUMIN 3.9  --   --   --   AST 69*  --   --   --   ALT 59*  --   --   --   ALKPHOS 59  --   --   --   BILITOT 0.7  --   --   --   GFRNONAA >60 >60 >60 >60  ANIONGAP 13 11 9 9      Hematology Recent Labs  Lab 06/08/20 1246 06/09/20 0712 06/10/20 0451  WBC 6.1 5.3 7.1  RBC 5.89* 6.19* 6.26*  HGB 17.7* 18.6* 18.9*  HCT 51.8 56.1* 57.0*  MCV 87.9 90.6 91.1  MCH 30.1 30.0 30.2  MCHC 34.2 33.2 33.2  RDW 12.8 12.8 12.5  PLT 159 129* 136*    Cardiac EnzymesNo results for input(s): TROPONINI in the last 168 hours. No results for input(s): TROPIPOC in the last 168 hours.   BNPNo results for input(s): BNP, PROBNP in the last 168 hours.   DDimer No results for input(s): DDIMER in the last  168 hours.   Radiology    MR BRAIN WO CONTRAST  Result Date: 06/08/2020 CLINICAL DATA:  Stroke follow-up EXAM: MRI HEAD WITHOUT CONTRAST TECHNIQUE: Multiplanar, multiecho pulse sequences of the brain and surrounding structures were obtained without intravenous contrast. COMPARISON:  06/07/2020 FINDINGS: Brain: Small area abnormal diffusion restriction in the left thalamus and medial left temporal lobe. Punctate foci of abnormal diffusion restriction in the left occipital lobe. No acute or chronic hemorrhage. Skull and upper cervical spine: Normal calvarium and skull base. Visualized upper cervical spine and soft tissues are normal. Sinuses/Orbits:No paranasal sinus fluid levels or advanced mucosal thickening. No mastoid or middle ear effusion. Normal orbits. IMPRESSION: There are a few new punctate foci of acute ischemia in the left PCA territory, in addition to the unchanged left thalamic and medial temporal lobe infarcts Electronically Signed   By: Deatra Robinson M.D.   On: 06/08/2020 19:25   ECHOCARDIOGRAM COMPLETE  Result Date: 06/08/2020    ECHOCARDIOGRAM REPORT   Patient Name:   Jason Gibbs Date of Exam: 06/08/2020 Medical Rec #:   [id number]     Height:       72.0 in Accession #:    [id number]    Weight:       200.6 lb Date of Birth:  1975/10/28    BSA:          2.133 m Patient Age:    44 years      BP:           114/87 mmHg Patient Gender: M             HR:           95 bpm. Exam Location:  Inpatient Procedure: 2D Echo Indications:    Stroke; Atrial Fibrillation I48.91  History:        Patient has no prior history of Echocardiogram examinations.  Sonographer:    Thurman Coyer RDCS (AE) Referring Phys: [id number] SRISHTI L BHAGAT IMPRESSIONS  1. Left ventricular ejection fraction, by estimation, is 30 to 35%. The left ventricle has moderately decreased function. The left ventricle demonstrates global hypokinesis. Left ventricular diastolic function could not be evaluated.  2. Right ventricular systolic function is moderately reduced. The right ventricular size is mildly enlarged. Tricuspid regurgitation signal is inadequate for assessing PA pressure.  3. Left atrial size was moderately dilated.  4. The mitral valve is normal in structure. Mild to moderate mitral valve regurgitation.  5. The aortic valve is normal in structure. Aortic valve regurgitation is not visualized.  6. The inferior vena cava is dilated in size with >50% respiratory variability, suggesting right atrial pressure of 8 mmHg. FINDINGS  Left Ventricle: Left ventricular ejection fraction, by estimation, is 30 to 35%. The left ventricle has moderately decreased function. The left ventricle demonstrates global hypokinesis. The left ventricular internal cavity size was normal in size. There is no left ventricular hypertrophy. Left ventricular diastolic function could not be evaluated due to atrial fibrillation. Left ventricular diastolic function could not be evaluated. Right Ventricle: The right ventricular size is mildly enlarged. No increase in right ventricular wall thickness. Right ventricular systolic function is moderately reduced. Tricuspid regurgitation signal is  inadequate for assessing PA pressure. Left Atrium: Left atrial size was moderately dilated. Right Atrium: Right atrial size was normal in size. Pericardium: There is no evidence of pericardial effusion. Mitral Valve: The mitral valve is normal in structure. Mild to moderate mitral valve regurgitation, with centrally-directed jet. Tricuspid Valve: The tricuspid valve is  normal in structure. Tricuspid valve regurgitation is not demonstrated. Aortic Valve: The aortic valve is normal in structure. Aortic valve regurgitation is not visualized. Pulmonic Valve: The pulmonic valve was grossly normal. Pulmonic valve regurgitation is not visualized. Aorta: The aortic root and ascending aorta are structurally normal, with no evidence of dilitation. Venous: The inferior vena cava is dilated in size with greater than 50% respiratory variability, suggesting right atrial pressure of 8 mmHg. IAS/Shunts: No atrial level shunt detected by color flow Doppler.  LEFT VENTRICLE PLAX 2D LVIDd:         5.00 cm LVIDs:         4.10 cm LV PW:         1.00 cm LV IVS:        1.00 cm LVOT diam:     2.10 cm LV SV:         52 LV SV Index:   24 LVOT Area:     3.46 cm  RIGHT VENTRICLE RV Basal diam:  4.40 cm RV S prime:     7.72 cm/s TAPSE (M-mode): 1.1 cm LEFT ATRIUM           Index       RIGHT ATRIUM           Index LA diam:      4.50 cm 2.11 cm/m  RA Area:     16.00 cm LA Vol (A2C): 60.3 ml 28.27 ml/m RA Volume:   41.20 ml  19.31 ml/m LA Vol (A4C): 60.4 ml 28.33 ml/m  AORTIC VALVE LVOT Vmax:   93.70 cm/s LVOT Vmean:  61.067 cm/s LVOT VTI:    0.149 m  AORTA Ao Root diam: 3.50 cm MR Peak grad: 77.4 mmHg MR Mean grad: 55.0 mmHg   SHUNTS MR Vmax:      440.00 cm/s Systemic VTI:  0.15 m MR Vmean:     357.0 cm/s  Systemic Diam: 2.10 cm Rachelle HoraMihai Croitoru MD Electronically signed by Thurmon FairMihai Croitoru MD Signature Date/Time: 06/08/2020/12:42:02 PM    Final     Cardiac Studies   Echo:  1. Left ventricular ejection fraction, by estimation, is 30 to  35%. The  left ventricle has moderately decreased function. The left ventricle  demonstrates global hypokinesis. Left ventricular diastolic function could  not be evaluated.  2. Right ventricular systolic function is moderately reduced. The right  ventricular size is mildly enlarged. Tricuspid regurgitation signal is  inadequate for assessing PA pressure.  3. Left atrial size was moderately dilated.  4. The mitral valve is normal in structure. Mild to moderate mitral valve  regurgitation.  5. The aortic valve is normal in structure. Aortic valve regurgitation is  not visualized.  6. The inferior vena cava is dilated in size with >50% respiratory  variability, suggesting right atrial pressure of 8 mmHg.   Patient Profile     45 y.o. male with no known PMH, ETOH abuse, tobacco abuse, who presented with acute right sided weakness and paresthesia, difficulty ambulation on 06/07/20. He wasgivenTPA 06/07/2020. Cardiology was consulted for A fib with RVR and heart failure.   Assessment & Plan    AFIB:  Rapid ventricular rate.   His metoprolol has been increased to 50 mg IR Q6 and hopefully this will allow us to transition to 200 mg XL in the morning and his rate will be controlled enough to allow discharge.  I will keep NPO after MN with the outside chance that he will need TEE/DCCV which we would  prefer not to do with recent CVA.  OK to continue digoxin PO for now.  I will hopefully start Entresto before discharge.  OK to start Xarelto today (20 mg) if OK with neurology.  We will arrange for him to get this at discharge and work with our social work team to see how we can keep him in this.   ACUTE SYSTOLIC HF:  Euvolemic.  See above.     For questions or updates, please contact [facility] Please consult [email or web address] for contact info under Cardiology/STEMI.   Signed, Rollene Rotunda, MD  06/10/2020, 10:07 AM

## 2020-06-10 NOTE — Progress Notes (Signed)
NAME:  Jason Gibbs, MRN:  [id number], DOB:  [birth year], LOS: 3 ADMISSION DATE:  06/07/2020, CONSULTATION DATE:  06/10/20 REFERRING MD:  Iver Nestle, CHIEF COMPLAINT:   R-sided weakness  History of Present Illness:  45 y.o. M with PMH tobacco and alcohol use who was at work when he developed R facial droop, R hand weakness and gait difficulty. Last known well 1600 on 4/18.  He was taken to the ED as a code stroke and received TPA.  Initial head CT was without acute findings.  Labs with slightly elevated LFT's  and Na 133.    He was evaluated and admitted by neurology.   CTA was obtained with distal L P2 occlusion and cardiac rhythm.   He was in atrial fibrillation with RVR during ED course and was started on Cardizem gtt.  He had no hypotension or electrolyte abnormalities.  He remained in RVR despite maximum Cardizem gtt, so PCCM was consulted.  Pt is awake and alert, states that he has intermittently had palpitations for years and sometimes has exertional shortness of breath.  He denies current palpitations or dyspnea and is stable on RA.  Denies chest pain  Pertinent  Medical History  Tobacco and alcohol use  Significant Hospital Events: Including procedures, antibiotic start and stop dates in addition to other pertinent events   . 4/18 Presented to ED, given TPA, PCCM consult for afib  Interim History / Subjective:  Patient remained in A. fib with RVR heart rate in 120s He has not been eating or drinking well, looks dehydrated with concentrated urine  Objective   Blood pressure (!) 127/110, pulse (!) 107, temperature 99.5 F (37.5 C), temperature source Oral, resp. rate 18, weight 88.8 kg, SpO2 96 %.        Intake/Output Summary (Last 24 hours) at 06/10/2020 1045 Last data filed at 06/10/2020 1000 Gross per 24 hour  Intake 307.1 ml  Output 900 ml  Net -592.9 ml   Filed Weights   06/07/20 1700 06/09/20 0400  Weight: 91 kg 88.8 kg    General:  Well-nourished, middle aged M, lying  in the bed HEENT: MM pink/severely dry mucous membranes, no facial droop, pupils equal, no JVD Neuro: awake, alert, conversational without facial droop, dysarthria or confusion, antigravity in all 4 extremities CV: Irregularly irregular, tachycardic, no murmur or gallop PULM:  Clear bilaterally, no wheezes GI: soft, nondistended, nontender bsx4 active, Extremities: warm/dry, no edema  Skin: no rashes or lesions   Labs/imaging that I havepersonally reviewed  (right click and "Reselect all SmartList Selections" daily)  MRI brain: Small acute/subacute infarcts of the left thalamus and medial temporal lobe, both within the left PCA territory. No hemorrhage or mass effect. Serum creatinine 1.1  Echocardiogram showed EF 30-35% with global hypokinesis Resolved Hospital Problem list     Assessment & Plan:   New diagnosis of Afib with RVR Patient continued to have rapid ventricular response with A. 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 Continue secondary stroke prophylaxis MRI confirmed left mesial temporal lobe and left thalamic stroke Management per stroke team  New diagnosis systolic congestive heart failure  Echocardiogram is suggestive of EF 30 to 35% likely related to tachycardia/alcohol abuse Patient looks volume down Encourage oral fluid intake Appreciate cardiology input Started on metoprolol which will be switched to Toprol at discharge With outpatient follow-up  Acute kidney injury due to dehydration Patient has poor oral intake, has not been eating and drinking well  since in the hospital Encouraged to drink more water Monitor serum creatinine  Alcohol abuse Tobacco dependence Continue thiamine and folate Watch for signs of withdrawal Continue nicotine patch  Best practice (right click and "Reselect all SmartList Selections" daily)  Per primary Code Status:  full code Disposition: Per primary team  Labs   CBC: Recent Labs  Lab 06/07/20 1747 06/08/20 0330 06/08/20 1246 06/09/20 0712 06/10/20 0451  WBC 5.6 6.7 6.1 5.3 7.1  NEUTROABS 3.0  --   --   --   --   HGB 16.1 17.5* 17.7* 18.6* 18.9*  HCT 47.7 50.9 51.8 56.1* 57.0*  MCV 89.3 87.9 87.9 90.6 91.1  PLT 142* 153 159 129* 136*    Basic Metabolic Panel: Recent Labs  Lab 06/07/20 1900 06/08/20 0330 06/08/20 1246 06/09/20 0712 06/10/20 0451  NA 133* 135  --  136 135  K 3.7 4.0  --  3.8 4.2  CL 100 103  --  103 103  CO2 20* 21*  --  24 23  GLUCOSE 125* 115*  --  98 96  BUN 9 5*  --  9 15  CREATININE 0.68 0.68  --  0.81 1.13  CALCIUM 8.4* 8.9  --  9.4 9.4  MG 2.1  --  2.0  --   --   PHOS  --   --  2.8  --   --    GFR: CrCl cannot be calculated (Unknown ideal weight.). Recent Labs  Lab 06/08/20 0330 06/08/20 1246 06/09/20 0712 06/10/20 0451  WBC 6.7 6.1 5.3 7.1    Liver Function Tests: Recent Labs  Lab 06/07/20 1900  AST 69*  ALT 59*  ALKPHOS 59  BILITOT 0.7  PROT 7.0  ALBUMIN 3.9   No results for input(s): LIPASE, AMYLASE in the last 168 hours. No results for input(s): AMMONIA in the last 168 hours.  ABG No results found for: PHART, PCO2ART, PO2ART, HCO3, TCO2, ACIDBASEDEF, O2SAT   Coagulation Profile: Recent Labs  Lab 06/07/20 1900  INR 1.2    Cardiac Enzymes: No results for input(s): CKTOTAL, CKMB, CKMBINDEX, TROPONINI in the last 168 hours.  HbA1C: Hgb A1c MFr Bld  Date/Time Value Ref Range Status  06/08/2020 03:30 AM 5.6 4.8 - 5.6 % Final    Comment:    (NOTE) Pre diabetes:          5.7%-6.4%  Diabetes:              >6.4%  Glycemic  control for   <7.0% adults with diabetes     CBG: Recent Labs  Lab 06/07/20 1748  GLUCAP 126*        Cheri Fowler MD [facility] See Amion for pager If no response to pager, please call [phone] until 7pm After 7pm, Please call E-link [phone]

## 2020-06-10 NOTE — Progress Notes (Signed)
ANTICOAGULATION CONSULT NOTE - Follow-Up Consult  Pharmacy Consult for Heparin >> Xarelto Indication: pulmonary embolus  No Known Allergies  Patient Measurements: Weight: 88.8 kg (195 lb 12.3 oz) Heparin Dosing Weight: 88.8 kg   Vital Signs: Temp: 99.6 F (37.6 C) (04/21 0400) Temp Source: Oral (04/21 0400) BP: 117/91 (04/21 0600)  Labs: Recent Labs    06/07/20 1900 06/08/20 0330 06/08/20 1246 06/09/20 0712 06/09/20 2041 06/10/20 0451  HGB  --  17.5* 17.7* 18.6*  --  18.9*  HCT  --  50.9 51.8 56.1*  --  57.0*  PLT  --  153 159 129*  --  136*  APTT 24  --   --   --   --   --   LABPROT 14.7  --   --   --   --   --   INR 1.2  --   --   --   --   --   HEPARINUNFRC  --   --   --   --  0.15* 0.31  CREATININE 0.68 0.68  --  0.81  --  1.13    CrCl cannot be calculated (Unknown ideal weight.).   Medical History: History reviewed. No pertinent past medical history.  Medications:  No medications prior to admission.    Assessment: 12 YOM with h/o of Afib who presented with R-sided deficits found to have a L P2 occlusion s/p tPA. Pharmacy consulted to start IV heparin for AFib and secondary stroke prevention.   Heparin level this morning was therapeutic x 2 (HL 0.31, 0.33) now with plans to transition to Xarelto - will continue Heparin and make the transition this evening. Transition plans discussed with the RN.   Goal of Therapy:  Heparin level 0.3-0.5 units/ml Monitor platelets by anticoagulation protocol: Yes Appropriate anticoagulation for indication and hepatic/renal function    Plan:  - Continue Heparin at 1350 units/hr (13.5 ml/hr) - thru 1659 - Start Xarelto 20 mg daily with supper to start at 1700 - Pharmacy will plan to provide education prior to discharge  Thank you for allowing pharmacy to be a part of this patient's care.  Georgina Pillion, PharmD, BCPS Clinical Pharmacist Clinical phone for 06/10/2020: [phone] 06/10/2020 11:46 AM   **Pharmacist phone  directory can now be found on [email or web address] ([email or web address]).  Listed under [facility].

## 2020-06-11 ENCOUNTER — Encounter (HOSPITAL_COMMUNITY)
Admission: EM | Disposition: A | Discharge status: Home / Self Care | Payer: Self-pay | Source: Home / Self Care | Attending: Neurology

## 2020-06-11 ENCOUNTER — Inpatient Hospital Stay (HOSPITAL_COMMUNITY): Payer: Self-pay | Admitting: Certified Registered Nurse Anesthetist

## 2020-06-11 ENCOUNTER — Inpatient Hospital Stay (HOSPITAL_COMMUNITY): Payer: Self-pay

## 2020-06-11 ENCOUNTER — Encounter (HOSPITAL_COMMUNITY): Payer: Self-pay | Admitting: Neurology

## 2020-06-11 DIAGNOSIS — I340 Nonrheumatic mitral (valve) insufficiency: Secondary | ICD-10-CM

## 2020-06-11 DIAGNOSIS — I4891 Unspecified atrial fibrillation: Secondary | ICD-10-CM

## 2020-06-11 DIAGNOSIS — I513 Intracardiac thrombosis, not elsewhere classified: Secondary | ICD-10-CM

## 2020-06-11 DIAGNOSIS — I639 Cerebral infarction, unspecified: Secondary | ICD-10-CM

## 2020-06-11 HISTORY — PX: TEE WITHOUT CARDIOVERSION: SHX5443

## 2020-06-11 LAB — CBC
HCT: 60.7 % — ABNORMAL HIGH (ref 39.0–52.0)
Hemoglobin: 20.2 g/dL — ABNORMAL HIGH (ref 13.0–17.0)
MCH: 29.7 pg (ref 26.0–34.0)
MCHC: 33.3 g/dL (ref 30.0–36.0)
MCV: 89.4 fL (ref 80.0–100.0)
Platelets: 153 10*3/uL (ref 150–400)
RBC: 6.79 MIL/uL — ABNORMAL HIGH (ref 4.22–5.81)
RDW: 12.4 % (ref 11.5–15.5)
WBC: 8 10*3/uL (ref 4.0–10.5)
nRBC: 0 % (ref 0.0–0.2)

## 2020-06-11 LAB — BASIC METABOLIC PANEL
Anion gap: 13 (ref 5–15)
BUN: 16 mg/dL (ref 6–20)
CO2: 20 mmol/L — ABNORMAL LOW (ref 22–32)
Calcium: 9.6 mg/dL (ref 8.9–10.3)
Chloride: 104 mmol/L (ref 98–111)
Creatinine, Ser: 0.96 mg/dL (ref 0.61–1.24)
GFR, Estimated: >60 mL/min (ref >60)
Glucose, Bld: 96 mg/dL (ref 70–99)
Potassium: 4.1 mmol/L (ref 3.5–5.1)
Sodium: 137 mmol/L (ref 135–145)

## 2020-06-11 SURGERY — ECHOCARDIOGRAM, TRANSESOPHAGEAL
Anesthesia: Monitor Anesthesia Care

## 2020-06-11 MED ORDER — PROPOFOL 500 MG/50ML IV EMUL
INTRAVENOUS | Status: DC | PRN
  Administered 2020-06-11: 175 ug/kg/min via INTRAVENOUS

## 2020-06-11 MED ORDER — PROPOFOL 10 MG/ML IV BOLUS
INTRAVENOUS | Status: DC | PRN
  Administered 2020-06-11 (×2): 20 mg via INTRAVENOUS

## 2020-06-11 MED ORDER — LIDOCAINE 2% (20 MG/ML) 5 ML SYRINGE
INTRAMUSCULAR | Status: DC | PRN
  Administered 2020-06-11: 100 mg via INTRAVENOUS

## 2020-06-11 MED ORDER — SODIUM CHLORIDE 0.9 % IV SOLN: INTRAVENOUS | Status: DC

## 2020-06-11 MED ORDER — PHENYLEPHRINE HCL (PRESSORS) 10 MG/ML IV SOLN
INTRAVENOUS | Status: DC | PRN
  Administered 2020-06-11 (×4): 80 ug via INTRAVENOUS

## 2020-06-11 MED ORDER — PERFLUTREN LIPID MICROSPHERE
INTRAVENOUS | Status: DC | PRN
  Administered 2020-06-11: 4 mL via INTRAVENOUS

## 2020-06-11 MED ORDER — DEXMEDETOMIDINE (PRECEDEX) IN NS 20 MCG/5ML (4 MCG/ML) IV SYRINGE
PREFILLED_SYRINGE | INTRAVENOUS | Status: DC | PRN
  Administered 2020-06-11: 20 ug via INTRAVENOUS

## 2020-06-11 MED ORDER — METOPROLOL SUCCINATE ER 50 MG PO TB24
150.0000 mg | ORAL_TABLET | Freq: Every evening | ORAL | Status: DC
  Administered 2020-06-11: 150 mg via ORAL
  Filled 2020-06-11: qty 1

## 2020-06-11 NOTE — Plan of Care (Signed)
Shared Decision Making/Informed Consent  The risks [stroke, cardiac arrhythmias rarely resulting in the need for a temporary or permanent pacemaker, skin irritation or burns, esophageal damage, perforation (1:10,000 risk), bleeding, pharyngeal hematoma as well as other potential complications associated with conscious sedation including aspiration, arrhythmia, respiratory failure and death], benefits (treatment guidance, restoration of normal sinus rhythm, diagnostic support) and alternatives of a transesophageal echocardiogram guided cardioversion were discussed in detail with Jason Gibbs and he is willing to proceed.

## 2020-06-11 NOTE — Transfer of Care (Signed)
Immediate Anesthesia Transfer of Care Note  Patient: Jason Gibbs  Procedure(s) Performed: TRANSESOPHAGEAL ECHOCARDIOGRAM (TEE) (N/A )  Patient Location: Endoscopy Unit  Anesthesia Type:MAC  Level of Consciousness: drowsy  Airway & Oxygen Therapy: Patient Spontanous Breathing  Post-op Assessment: Report given to RN and Post -op Vital signs reviewed and stable  Post vital signs: Reviewed and stable  Last Vitals:  Vitals Value Taken Time  BP 106/68 06/11/20 1410  Temp 36.9 C 06/11/20 1410  Pulse 128 06/11/20 1410  Resp 23 06/11/20 1410  SpO2 100 % 06/11/20 1410    Last Pain:  Vitals:   06/11/20 1410  TempSrc: Oral  PainSc: Asleep         Complications: No complications documented.

## 2020-06-11 NOTE — CV Procedure (Signed)
    Transesophageal Echocardiogram Note  Hermann Dottavio [id number] 21-Jun-1975  Procedure: Transesophageal Echocardiogram Indications: atrial fib   Procedure Details Consent: Obtained Time Out: Verified patient identification, verified procedure, site/side was marked, verified correct patient position, special equipment/implants available, Radiology Safety Procedures followed,  medications/allergies/relevent history reviewed, required imaging and test results available.  Performed  Medications:  During this procedure the patient is administered  Precidex 20 mcg IV followed by Propofol 400 mg IV  Sedation. By CRNA Elease Hashimoto.  The patient's heart rate, blood pressure, and oxygen saturation are monitored continuously during the procedure. The period of conscious sedation is  30  minutes, of which I was present face-to-face 100% of this time.  Left Ventrical:  Markedly reduced LV function EF 20-255.   Mitral Valve: normal ,  Trivial MR   Aortic Valve: normal   Tricuspid Valve: grossly normal   Pulmonic Valve:   Left Atrium/ Left atrial appendage: there is a highly mobile mass in the LAA.   This is very suspicious for LAA thrombus.   Atrial septum: no ASD by color doppler   Aorta:  Mld calcified plaque    Complications: No apparent complications Patient did tolerate procedure well.   Vesta Mixer, Montez Hageman., MD, [facility] 06/11/2020, 2:07 PM

## 2020-06-11 NOTE — Interval H&P Note (Signed)
History and Physical Interval Note:  06/11/2020 1:16 PM  Jason Gibbs  has presented today for surgery, with the diagnosis of afib.  The various methods of treatment have been discussed with the patient and family. After consideration of risks, benefits and other options for treatment, the patient has consented to  Procedure(s): TRANSESOPHAGEAL ECHOCARDIOGRAM (TEE) (N/A) CARDIOVERSION (N/A) as a surgical intervention.  The patient's history has been reviewed, patient examined, no change in status, stable for surgery.  I have reviewed the patient's chart and labs.  Questions were answered to the patient's satisfaction.     Kristeen Miss

## 2020-06-11 NOTE — Progress Notes (Signed)
Report given to Netta Cedars, patient still in Endo at this time. Patient belongings taken from 4N to 3W, including clothes, phone and phone charger.

## 2020-06-11 NOTE — Progress Notes (Addendum)
NURSING PROGRESS NOTE   Jason Gibbs is a 45 y.o. male patient transferred from ENDO, arrived to the unit at 38 via -No acute distress noted.  -No complaints of shortness of breath.  -No complaints of chest pain.   Cardiac Monitoring: Box # 30 in place. Cardiac monitor yields: A-fib noted and MD notified.  Blood pressure (!) 124/96, pulse 63, temperature 98.3 F (36.8 C), temperature source Oral, resp. rate 18, weight 88.8 kg, SpO2 96 %.   IV Fluids:  IV in place, occlusive dsg intact without redness, IV cath    Allergies:  Patient has no known allergies.  Past Medical History:   has a past medical history of Dysrhythmia.  Past Surgical History:   has no past surgical history on file.  Social History:   reports that he has been smoking cigarettes. He has never used smokeless tobacco. He reports current alcohol use of about 6.0 standard drinks of alcohol per week. He reports current drug use. Drug: Marijuana.  Skin: intact, with no bruising of skin tear.  Patient/Family orientated to room. Information packet given to patient. Admission inpatient armband information verified with patient to include name and date of birth and placed on patient arm. Side rails up x 2, fall assessment and education completed with patient. Patient able to verbalize understanding of risk associated with falls and verbalized understanding to call for assistance before getting out of bed. Call light within reach. Patient able to voice and demonstrate understanding of unit orientation instructions.    Will continue to evaluate and treat per MD orders.

## 2020-06-11 NOTE — Progress Notes (Addendum)
STROKE TEAM PROGRESS NOTE   SUBJECTIVE (INTERVAL HISTORY)  Continues to be in atrial fibrillation with slight improvement in rate overnight (98-105).  Vitals signs otherwise stable.  Exam unchanged. Creatinine down to 0.96.  Heparin infusion off.  Xarelto started yesterday.  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  Cardiology recommended changing short acting metoprolol to Toprol 150 Q pm and continue digoxin.     OBJECTIVE Temp:  [97.6 F (36.4 C)-99.4 F (37.4 C)] 99.4 F (37.4 C) (04/22 0400) Pulse Rate:  [103-120] 103 (04/21 1800) Cardiac Rhythm: Atrial fibrillation (04/21 2000) Resp:  [11-26] 15 (04/22 0700) BP: (103-158)/(79-132) 124/94 (04/22 0700)  Recent Labs  Lab 06/07/20 1748  GLUCAP 126*   Recent Labs  Lab 06/07/20 1900 06/08/20 0330 06/08/20 1246 06/09/20 0712 06/10/20 0451 06/11/20 0451  NA 133* 135  --  136 135 137  K 3.7 4.0  --  3.8 4.2 4.1  CL 100 103  --  103 103 104  CO2 20* 21*  --  24 23 20*  GLUCOSE 125* 115*  --  98 96 96  BUN 9 5*  --  9 15 16   CREATININE 0.68 0.68  --  0.81 1.13 0.96  CALCIUM 8.4* 8.9  --  9.4 9.4 9.6  MG 2.1  --  2.0  --   --   --   PHOS  --   --  2.8  --   --   --    Recent Labs  Lab 06/07/20 1900  AST 69*  ALT 59*  ALKPHOS 59  BILITOT 0.7  PROT 7.0  ALBUMIN 3.9   Recent Labs  Lab 06/07/20 1747 06/08/20 0330 06/08/20 1246 06/09/20 0712 06/10/20 0451 06/11/20 0451  WBC 5.6 6.7 6.1 5.3 7.1 8.0  NEUTROABS 3.0  --   --   --   --   --   HGB 16.1 17.5* 17.7* 18.6* 18.9* 20.2*  HCT 47.7 50.9 51.8 56.1* 57.0* 60.7*  MCV 89.3 87.9 87.9 90.6 91.1 89.4  PLT 142* 153 159 129* 136* 153   No results for input(s): CKTOTAL, CKMB, CKMBINDEX, TROPONINI in the last 168 hours. No results for input(s): LABPROT, INR in the last 72 hours. No results for input(s): COLORURINE, LABSPEC, PHURINE, GLUCOSEU, HGBUR, BILIRUBINUR, KETONESUR, PROTEINUR, UROBILINOGEN, NITRITE, LEUKOCYTESUR in the last 72 hours.  Invalid  input(s): APPERANCEUR     Component Value Date/Time   CHOL 214 (H) 06/08/2020 0330   TRIG 104 06/08/2020 0330   HDL 44 06/08/2020 0330   CHOLHDL 4.9 06/08/2020 0330   VLDL 21 06/08/2020 0330   LDLCALC 149 (H) 06/08/2020 0330   Lab Results  Component Value Date   HGBA1C 5.6 06/08/2020      Component Value Date/Time   LABOPIA NONE DETECTED 06/08/2020 1224   COCAINSCRNUR NONE DETECTED 06/08/2020 1224   LABBENZ NONE DETECTED 06/08/2020 1224   AMPHETMU NONE DETECTED 06/08/2020 1224   THCU NONE DETECTED 06/08/2020 1224   LABBARB NONE DETECTED 06/08/2020 1224    Recent Labs  Lab 06/07/20 2227  ETH 71*    I have personally reviewed the radiological images below and agree with the radiology interpretations.  CT ANGIO HEAD W OR WO CONTRAST  Result Date: 06/07/2020 IMPRESSION: 1. Distal left P2 segment occlusion. 2. No other intracranial arterial occlusion or high-grade stenosis. 3. Normal cervical carotid and vertebral arteries. Electronically Signed   By: 06/09/2020 M.D.   On: 06/07/2020 21:22   CT ANGIO NECK  W OR WO CONTRAST  Result Date: 06/07/2020 IMPRESSION: 1. Distal left P2 segment occlusion. 2. No other intracranial arterial occlusion or high-grade stenosis. 3. Normal cervical carotid and vertebral arteries. Electronically Signed   By: Deatra Robinson M.D.   On: 06/07/2020 21:22   MR BRAIN WO CONTRAST  Result Date: 06/08/2020  IMPRESSION: There are a few new punctate foci of acute ischemia in the left PCA territory, in addition to the unchanged left thalamic and medial temporal lobe infarcts Electronically Signed   By: Deatra Robinson M.D.   On: 06/08/2020 19:25   MR BRAIN WO CONTRAST  Result Date: 06/07/2020 IMPRESSION: Small acute/subacute infarcts of the left thalamus and medial temporal lobe, both within the left PCA territory. No hemorrhage or mass effect. Electronically Signed   By: Deatra Robinson M.D.   On: 06/07/2020 22:01   ECHOCARDIOGRAM COMPLETE  Result Date:  06/08/2020 IMPRESSIONS   1. Left ventricular ejection fraction, by estimation, is 30 to 35%. The left ventricle has moderately decreased function. The left ventricle demonstrates global hypokinesis. Left ventricular diastolic function could not be evaluated.   2. Right ventricular systolic function is moderately reduced. The right ventricular size is mildly enlarged. Tricuspid regurgitation signal is inadequate for assessing PA pressure.   3. Left atrial size was moderately dilated.   4. The mitral valve is normal in structure. Mild to moderate mitral valve regurgitation.   5. The aortic valve is normal in structure. Aortic valve regurgitation is not visualized.   6. The inferior vena cava is dilated in size with >50% respiratory variability, suggesting right atrial pressure of 8 mmHg.  CT HEAD CODE STROKE WO CONTRAST Result Date: 06/07/2020 IMPRESSION: 1. No acute intracranial abnormality. 2. ASPECTS is 10 3. Code stroke imaging results were communicated on 06/07/2020 at 6:03 pm to provider Bhagat via text page Electronically Signed   By: Marlan Palau M.D.   On: 06/07/2020 18:04    PHYSICAL EXAM  Temp:  [97.6 F (36.4 C)-99.4 F (37.4 C)] 99.4 F (37.4 C) (04/22 0400) Pulse Rate:  [103-120] 103 (04/21 1800) Resp:  [11-26] 15 (04/22 0700) BP: (103-158)/(79-132) 124/94 (04/22 0700)  General - Well nourished, well developed, in no apparent distress.  Ophthalmologic - fundi not visualized due to noncooperation.  Cardiovascular - irregularly irregular heart rate and rhythm.  Neuro - awake, alert, oriented x4.  No aphasia, no dysarthria, paucity of speech, following all simple commands. Able to name and repeat. No gaze palsy, tracking bilaterally, visual field exam showed right upper quadrantanopia, right lower quadrant decreased visual acuity. PERRL. No facial droop. Tongue midline. Bilateral UEs 5/5, no drift. Bilaterally LEs 5/5, no drift. Sensation symmetrical bilaterally, b/l FTN and HTS  intact, gait not tested.   ASSESSMENT/PLAN Mr. Jason Gibbs is a 45 y.o. male with history of smoker and alcohol abuse admitted for right-sided weakness, right side numbness and difficulty walking. tPA given.  Found to have new diagnosed A. fib with RVR.  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 now transition to Xarelto 20mg  daily.  Ongoing aggressive stroke risk factor management  Therapy recommendations: none   Disposition: Home   A. 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  Cardiology on board  Currently on Toprol  Plan to start Entresto, Jardiance, and spironolactone at outpatient followup  Hypertension  Stable  On toprol  Long term BP goal normotensive  Hyperlipidemia  Home meds: None  LDL 149, goal < 70  Now on Lipitor 40  Continue statin at discharge  Tobacco abuse  Current smoker  Smoking cessation counseling provided  Pt is willing to quit  On nicotine patch  Alcohol abuse  5-6 beers per day per patient  Not on CIWA protocol  FA/MVI/B1  Alcohol limitation education provided  Other Active  Problems  Acute kidney injury: likely due to dehydration, creatinine 0.81->1.13->0.96, encourage continue po intake  Polycythemia - likely secondary to smoking and dehydration  Ivelisse Olivencia-Simmons,  NP-C  ATTENDING NOTE: I reviewed above note and agree with the assessment and plan. Pt was seen and examined.   No family at bedside, patient awake alert, sitting in bed, in no acute distress.  Overnight no acute event.  Still has A. fib RVR but improving.  Had TEE this a.m. with intention of DCCV, however it showed LAA thrombus, not a candidate for DCCV.  Cardiology recommend Toprol 150 Qpm, continue Xarelto and digoxin, and follow-up as outpatient for cardiomyopathy measurement.  Plan for DC tomorrow if patient stable.  For detailed assessment and plan, please refer to above as I have made changes wherever appropriate.   01-01-2005, MD PhD Stroke Neurology 06/11/2020 8:02 PM    To contact Stroke Continuity provider, please refer to 06/13/2020. After hours, contact General Neurology

## 2020-06-11 NOTE — Progress Notes (Addendum)
Per d/w Dr. Antoine Poche, patient has LAA thrombus on TEE therefore not a candidate for DCCV. He recommends to dc short acting metoprolol and change to Toprol 150mg  QPM, continue digoxin and observe overnight. Neuro team updated.

## 2020-06-11 NOTE — Progress Notes (Addendum)
Progress Note  Patient Name: Jason Gibbs Date of Encounter: 06/11/2020  [facility] Cardiologist: New to Dr Antoine Poche , but patient wants to follow up at  [facility] location  Subjective   Spanish interrupter used during encounter [phone]). Patient states he is feeling well, denied any chest pain, chest pressure, dizziness, syncope, SOB. He is tolerating PO. He denied any N/V/D. He do not wish TEE with DCCV on Monday but agrees if it can be arranged today.    Inpatient Medications    Scheduled Meds: . atorvastatin  40 mg Oral Daily  . Chlorhexidine Gluconate Cloth  6 each Topical Daily  . digoxin  0.25 mg Oral Daily  . folic acid  1 mg Oral Daily  . metoprolol tartrate  50 mg Oral Q6H  . multivitamin with minerals  1 tablet Oral Daily  . nicotine  14 mg Transdermal Daily  . rivaroxaban  20 mg Oral Q supper  . senna-docusate  1 tablet Oral BID  . thiamine  100 mg Oral Daily   Continuous Infusions:  PRN Meds: acetaminophen **OR** acetaminophen (TYLENOL) oral liquid 160 mg/5 mL **OR** acetaminophen, labetalol, LORazepam **OR** LORazepam   Vital Signs    Vitals:   06/11/20 0000 06/11/20 0400 06/11/20 0700 06/11/20 0800  BP:  (!) 144/132 (!) 124/94   Pulse:      Resp:  11 15   Temp: 99.4 F (37.4 C) 99.4 F (37.4 C)  98 F (36.7 C)  TempSrc: Oral Oral  Oral  SpO2:      Weight:        Intake/Output Summary (Last 24 hours) at 06/11/2020 1058 Last data filed at 06/11/2020 0400 Gross per 24 hour  Intake --  Output 700 ml  Net -700 ml   Last 3 Weights 06/09/2020 06/07/2020  Weight (lbs) 195 lb 12.3 oz 200 lb 9.9 oz  Weight (kg) 88.8 kg 91 kg      Telemetry    A fib with RVR 110-150s - Personally Reviewed  ECG    No new tracing today - Personally Reviewed  Physical Exam   GEN: No acute distress.   Neck: No JVD Cardiac: Irregularly irregular, no murmurs, rubs, or gallops Respiratory: Clear to auscultation bilaterally. On room air.  GI: Soft, nontender,  non-distended  MS: No BLE edema.  No deformity. Neuro:  Alert and oriented x3, fluent speech, follow commands appropriately, no focal weakness Psych: Normal affect   Labs    High Sensitivity Troponin:  No results for input(s): TROPONINIHS in the last 720 hours.    Chemistry Recent Labs  Lab 06/07/20 1900 06/08/20 0330 06/09/20 0712 06/10/20 0451 06/11/20 0451  NA 133*   < > 136 135 137  K 3.7   < > 3.8 4.2 4.1  CL 100   < > 103 103 104  CO2 20*   < > 24 23 20*  GLUCOSE 125*   < > 98 96 96  BUN 9   < > 9 15 16   CREATININE 0.68   < > 0.81 1.13 0.96  CALCIUM 8.4*   < > 9.4 9.4 9.6  PROT 7.0  --   --   --   --   ALBUMIN 3.9  --   --   --   --   AST 69*  --   --   --   --   ALT 59*  --   --   --   --   ALKPHOS 59  --   --   --   --  BILITOT 0.7  --   --   --   --   GFRNONAA >60   < > >60 >60 >60  ANIONGAP 13   < > 9 9 13    < > = values in this interval not displayed.     Hematology Recent Labs  Lab 06/09/20 0712 06/10/20 0451 06/11/20 0451  WBC 5.3 7.1 8.0  RBC 6.19* 6.26* 6.79*  HGB 18.6* 18.9* 20.2*  HCT 56.1* 57.0* 60.7*  MCV 90.6 91.1 89.4  MCH 30.0 30.2 29.7  MCHC 33.2 33.2 33.3  RDW 12.8 12.5 12.4  PLT 129* 136* 153    BNPNo results for input(s): BNP, PROBNP in the last 168 hours.   DDimer No results for input(s): DDIMER in the last 168 hours.   Radiology    No results found.  Cardiac Studies   Echo on 06/08/20:  1. Left ventricular ejection fraction, by estimation, is 30 to 35%. The  left ventricle has moderately decreased function. The left ventricle  demonstrates global hypokinesis. Left ventricular diastolic function could  not be evaluated.  2. Right ventricular systolic function is moderately reduced. The right  ventricular size is mildly enlarged. Tricuspid regurgitation signal is  inadequate for assessing PA pressure.  3. Left atrial size was moderately dilated.  4. The mitral valve is normal in structure. Mild to moderate mitral  valve  regurgitation.  5. The aortic valve is normal in structure. Aortic valve regurgitation is  not visualized.  6. The inferior vena cava is dilated in size with >50% respiratory  variability, suggesting right atrial pressure of 8 mmHg.  Patient Profile     Jason Gibbs a 45 y.o.malewith no known PMH, ETOH abuse, tobacco abuse, who presented with acute right sided weakness and paresthesia, difficulty ambulation on 06/07/20. He wasgivenTPA 06/07/2020. Cardiology is following for A fib with RVR and heart failure.    Assessment & Plan     Newly discovered A fib with RVR  - unknown onset and duration  - UDS negative for cocaine,patient deniedillicit drug use - rate isnot controlled with metoprolol 50mg  Q6H and digoxin 0.25mg  daily, will transition to XL at discharge - NPO since MN for possible TEE/DCCV since yesterday, lengthy time spent discussing risk versus benefit of DCCV, patient consent for DCCV today (does not wish to wait until Monday) -CHA2DS2VAScscore is3due to CHF, CVA.Recommendstartanticoagulation with Xarelto (to improve compliance with once daily dosing),  s/p TPA 06/07/20, cleared by neuro to start Xarelto 20mg  yesterday - uninsured, SW has arranged patient with 30 days supply and follow up with Tuesday and [facility] for refills   Newly discovered systolic and diastolic heart failure -Patientreports intermittent SOB and feeling irregular heartbeat over the past year -Echocardiogram4/19showed EF 30 to 35%, LV global hypokinesis,diastolic function cannot be evaluated,RV systolic function moderately reduced,RV mildly enlarged,left atrium moderately dilated,mild to moderate MR - HIV negative  - 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, would repeat Echo in4-6weeks outpatient, if EF without improvement, left heart catheterizationshould be considered for ischemic workup - monitor daily weight, intake and output,  recommend low sodium diet - recommend GDMT withinitiation ofmetoprolol XL at DC,if BP toleratingandrenal function stable,may consideradd onEntresto,Jardiance and spironolactoneat outpatient follow up  - patient is educated on low sodium diet, CHF signs, weight monitor, and fluid restriction  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 given A fib RVR , s/p TPA on 06/07/20, neurologically recovered  - Neuro cleared initiation of Xarelto on 06/10/20  Hyperlipidemia -LDL 149,  statin  started per neuro  Alcohol abuse -Patient drinks 5-6 beers daily, endorsed withdrawal symptoms at home -Monitor for DT, CIWA protocol -Recommend long-term folic acid and thiamine supplement -Discussed alcohol moderation  Polycythemia  - Hgb 16.1 POA, has been uptrending to 20.2 with Hct 60.7%  today over the past 3 days - clinically with mild dehydration today + chronic tobacco use  - consider hydration and further workup if not improving, defer to primary team   Smoking -Discussed smoking cessation  Follow up appointment with cardiology has been arranged on 06/25/20 at [place] location per patient preference.        For questions or updates, please contact [facility] Please consult [email or web address] for contact info under      Signed, Xika Zhao, NP  06/11/2020, 10:58 AM    History and all data above reviewed.  Patient examined.  I agree with the findings as above.  He has no complaints but is quite taciturn.  All communication is through the translator.  He denies pain or palpitations.  No SOB. The patient exam reveals COR:Irregular  ,  Lungs: Clear  ,  Abd: Positive bowel sounds, no rebound no guarding, Ext No edema  .  All available labs, radiology testing, previous records reviewed. Agree with documented assessment and plan.   Atrial fib:  Unable to control is rate.  BP will not allow up titration of meds.  He does consent to TEE/DCCV if we can do this today.  He has been on  heparin and now Xarelto.  We can determine the dose of beta blocker based on his sinus rate.  Also, perhaps we will have some room to move on starting Entersto before discharge if BP better after cardioversion.     Shanessa Hodak  11:35 AM  06/11/2020  

## 2020-06-11 NOTE — Anesthesia Preprocedure Evaluation (Signed)
Anesthesia Evaluation  Patient identified by MRN, date of birth, ID band Patient awake    Reviewed: Allergy & Precautions, H&P , NPO status , Patient's Chart, lab work & pertinent test results  Airway Mallampati: II   Neck ROM: full    Dental   Pulmonary Current Smoker,    breath sounds clear to auscultation       Cardiovascular + dysrhythmias Atrial Fibrillation  Rhythm:regular Rate:Normal     Neuro/Psych    GI/Hepatic   Endo/Other    Renal/GU      Musculoskeletal   Abdominal   Peds  Hematology   Anesthesia Other Findings   Reproductive/Obstetrics                             Anesthesia Physical Anesthesia Plan  ASA: II  Anesthesia Plan: MAC   Post-op Pain Management:    Induction: Intravenous  PONV Risk Score and Plan: 0 and Propofol infusion and Treatment may vary due to age or medical condition  Airway Management Planned: Nasal Cannula  Additional Equipment:   Intra-op Plan:   Post-operative Plan:   Informed Consent: I have reviewed the patients History and Physical, chart, labs and discussed the procedure including the risks, benefits and alternatives for the proposed anesthesia with the patient or authorized representative who has indicated his/her understanding and acceptance.     Dental advisory given  Plan Discussed with: CRNA, Anesthesiologist and Surgeon  Anesthesia Plan Comments:         Anesthesia Quick Evaluation

## 2020-06-11 NOTE — H&P (View-Only) (Signed)
Progress Note  Patient Name: Grey Rakestraw Date of Encounter: 06/11/2020  [facility] Cardiologist: New to Dr Antoine Poche , but patient wants to follow up at  [facility] location  Subjective   Spanish interrupter used during encounter [phone]). Patient states he is feeling well, denied any chest pain, chest pressure, dizziness, syncope, SOB. He is tolerating PO. He denied any N/V/D. He do not wish TEE with DCCV on Monday but agrees if it can be arranged today.    Inpatient Medications    Scheduled Meds: . atorvastatin  40 mg Oral Daily  . Chlorhexidine Gluconate Cloth  6 each Topical Daily  . digoxin  0.25 mg Oral Daily  . folic acid  1 mg Oral Daily  . metoprolol tartrate  50 mg Oral Q6H  . multivitamin with minerals  1 tablet Oral Daily  . nicotine  14 mg Transdermal Daily  . rivaroxaban  20 mg Oral Q supper  . senna-docusate  1 tablet Oral BID  . thiamine  100 mg Oral Daily   Continuous Infusions:  PRN Meds: acetaminophen **OR** acetaminophen (TYLENOL) oral liquid 160 mg/5 mL **OR** acetaminophen, labetalol, LORazepam **OR** LORazepam   Vital Signs    Vitals:   06/11/20 0000 06/11/20 0400 06/11/20 0700 06/11/20 0800  BP:  (!) 144/132 (!) 124/94   Pulse:      Resp:  11 15   Temp: 99.4 F (37.4 C) 99.4 F (37.4 C)  98 F (36.7 C)  TempSrc: Oral Oral  Oral  SpO2:      Weight:        Intake/Output Summary (Last 24 hours) at 06/11/2020 1058 Last data filed at 06/11/2020 0400 Gross per 24 hour  Intake --  Output 700 ml  Net -700 ml   Last 3 Weights 06/09/2020 06/07/2020  Weight (lbs) 195 lb 12.3 oz 200 lb 9.9 oz  Weight (kg) 88.8 kg 91 kg      Telemetry    A fib with RVR 110-150s - Personally Reviewed  ECG    No new tracing today - Personally Reviewed  Physical Exam   GEN: No acute distress.   Neck: No JVD Cardiac: Irregularly irregular, no murmurs, rubs, or gallops Respiratory: Clear to auscultation bilaterally. On room air.  GI: Soft, nontender,  non-distended  MS: No BLE edema.  No deformity. Neuro:  Alert and oriented x3, fluent speech, follow commands appropriately, no focal weakness Psych: Normal affect   Labs    High Sensitivity Troponin:  No results for input(s): TROPONINIHS in the last 720 hours.    Chemistry Recent Labs  Lab 06/07/20 1900 06/08/20 0330 06/09/20 0712 06/10/20 0451 06/11/20 0451  NA 133*   < > 136 135 137  K 3.7   < > 3.8 4.2 4.1  CL 100   < > 103 103 104  CO2 20*   < > 24 23 20*  GLUCOSE 125*   < > 98 96 96  BUN 9   < > 9 15 16   CREATININE 0.68   < > 0.81 1.13 0.96  CALCIUM 8.4*   < > 9.4 9.4 9.6  PROT 7.0  --   --   --   --   ALBUMIN 3.9  --   --   --   --   AST 69*  --   --   --   --   ALT 59*  --   --   --   --   ALKPHOS 59  --   --   --   --  BILITOT 0.7  --   --   --   --   GFRNONAA >60   < > >60 >60 >60  ANIONGAP 13   < > 9 9 13    < > = values in this interval not displayed.     Hematology Recent Labs  Lab 06/09/20 0712 06/10/20 0451 06/11/20 0451  WBC 5.3 7.1 8.0  RBC 6.19* 6.26* 6.79*  HGB 18.6* 18.9* 20.2*  HCT 56.1* 57.0* 60.7*  MCV 90.6 91.1 89.4  MCH 30.0 30.2 29.7  MCHC 33.2 33.2 33.3  RDW 12.8 12.5 12.4  PLT 129* 136* 153    BNPNo results for input(s): BNP, PROBNP in the last 168 hours.   DDimer No results for input(s): DDIMER in the last 168 hours.   Radiology    No results found.  Cardiac Studies   Echo on 06/08/20:  1. Left ventricular ejection fraction, by estimation, is 30 to 35%. The  left ventricle has moderately decreased function. The left ventricle  demonstrates global hypokinesis. Left ventricular diastolic function could  not be evaluated.  2. Right ventricular systolic function is moderately reduced. The right  ventricular size is mildly enlarged. Tricuspid regurgitation signal is  inadequate for assessing PA pressure.  3. Left atrial size was moderately dilated.  4. The mitral valve is normal in structure. Mild to moderate mitral  valve  regurgitation.  5. The aortic valve is normal in structure. Aortic valve regurgitation is  not visualized.  6. The inferior vena cava is dilated in size with >50% respiratory  variability, suggesting right atrial pressure of 8 mmHg.  Patient Profile     [name] a 45 y.o.malewith no known PMH, ETOH abuse, tobacco abuse, who presented with acute right sided weakness and paresthesia, difficulty ambulation on 06/07/20. He wasgivenTPA 06/07/2020. Cardiology is following for A fib with RVR and heart failure.    Assessment & Plan     Newly discovered A fib with RVR  - unknown onset and duration  - UDS negative for cocaine,patient deniedillicit drug use - rate isnot controlled with metoprolol 50mg  Q6H and digoxin 0.25mg  daily, will transition to XL at discharge - NPO since MN for possible TEE/DCCV since yesterday, lengthy time spent discussing risk versus benefit of DCCV, patient consent for DCCV today (does not wish to wait until Monday) -CHA2DS2VAScscore is3due to CHF, CVA.Recommendstartanticoagulation with Xarelto (to improve compliance with once daily dosing),  s/p TPA 06/07/20, cleared by neuro to start Xarelto 20mg  yesterday - uninsured, SW has arranged patient with 30 days supply and follow up with Tuesday and [facility] for refills   Newly discovered systolic and diastolic heart failure -Patientreports intermittent SOB and feeling irregular heartbeat over the past year -Echocardiogram4/19showed EF 30 to 35%, LV global hypokinesis,diastolic function cannot be evaluated,RV systolic function moderately reduced,RV mildly enlarged,left atrium moderately dilated,mild to moderate MR - HIV negative  - 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, would repeat Echo in4-6weeks outpatient, if EF without improvement, left heart catheterizationshould be considered for ischemic workup - monitor daily weight, intake and output,  recommend low sodium diet - recommend GDMT withinitiation ofmetoprolol XL at DC,if BP toleratingandrenal function stable,may consideradd onEntresto,Jardiance and spironolactoneat outpatient follow up  - patient is educated on low sodium diet, CHF signs, weight monitor, and fluid restriction  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 given A fib RVR , s/p TPA on 06/07/20, neurologically recovered  - Neuro cleared initiation of Xarelto on 06/10/20  Hyperlipidemia -LDL 149,  statin  started per neuro  Alcohol abuse -Patient drinks 5-6 beers daily, endorsed withdrawal symptoms at home -Monitor for DT, CIWA protocol -Recommend long-term folic acid and thiamine supplement -Discussed alcohol moderation  Polycythemia  - Hgb 16.1 POA, has been uptrending to 20.2 with Hct 60.7%  today over the past 3 days - clinically with mild dehydration today + chronic tobacco use  - consider hydration and further workup if not improving, defer to primary team   Smoking -Discussed smoking cessation  Follow up appointment with cardiology has been arranged on 06/25/20 at [facility] location per patient preference.        For questions or updates, please contact [facility] Please consult [email or web address] for contact info under      Signed, Cyndi Bender, NP  06/11/2020, 10:58 AM    History and all data above reviewed.  Patient examined.  I agree with the findings as above.  He has no complaints but is quite taciturn.  All communication is through the translator.  He denies pain or palpitations.  No SOB. The patient exam reveals HQP:[id number]  ,  Lungs: Clear  ,  Abd: Positive bowel sounds, no rebound no guarding, Ext No edema  .  All available labs, radiology testing, previous records reviewed. Agree with documented assessment and plan.   Atrial fib:  Unable to control is rate.  BP will not allow up titration of meds.  He does consent to TEE/DCCV if we can do this today.  He has been on  heparin and now Xarelto.  We can determine the dose of beta blocker based on his sinus rate.  Also, perhaps we will have some room to move on starting Entersto before discharge if BP better after cardioversion.     Fayrene Fearing Io Dieujuste  11:35 AM  06/11/2020

## 2020-06-11 NOTE — Progress Notes (Signed)
  Echocardiogram Echocardiogram Transesophageal has been performed.  Jason Gibbs 06/11/2020, 3:13 PM

## 2020-06-12 ENCOUNTER — Other Ambulatory Visit: Payer: Self-pay | Admitting: Cardiology

## 2020-06-12 DIAGNOSIS — I429 Cardiomyopathy, unspecified: Secondary | ICD-10-CM

## 2020-06-12 DIAGNOSIS — E785 Hyperlipidemia, unspecified: Secondary | ICD-10-CM

## 2020-06-12 DIAGNOSIS — I4819 Other persistent atrial fibrillation: Secondary | ICD-10-CM

## 2020-06-12 DIAGNOSIS — I639 Cerebral infarction, unspecified: Secondary | ICD-10-CM

## 2020-06-12 DIAGNOSIS — Z79899 Other long term (current) drug therapy: Secondary | ICD-10-CM

## 2020-06-12 LAB — BASIC METABOLIC PANEL
Anion gap: 10 (ref 5–15)
BUN: 20 mg/dL (ref 6–20)
CO2: 22 mmol/L (ref 22–32)
Calcium: 9.3 mg/dL (ref 8.9–10.3)
Chloride: 103 mmol/L (ref 98–111)
Creatinine, Ser: 0.93 mg/dL (ref 0.61–1.24)
GFR, Estimated: >60 mL/min (ref >60)
Glucose, Bld: 91 mg/dL (ref 70–99)
Potassium: 4.3 mmol/L (ref 3.5–5.1)
Sodium: 135 mmol/L (ref 135–145)

## 2020-06-12 LAB — CBC
HCT: 58.5 % — ABNORMAL HIGH (ref 39.0–52.0)
Hemoglobin: 19.3 g/dL — ABNORMAL HIGH (ref 13.0–17.0)
MCH: 29.7 pg (ref 26.0–34.0)
MCHC: 33 g/dL (ref 30.0–36.0)
MCV: 90 fL (ref 80.0–100.0)
Platelets: 160 10*3/uL (ref 150–400)
RBC: 6.5 MIL/uL — ABNORMAL HIGH (ref 4.22–5.81)
RDW: 12.5 % (ref 11.5–15.5)
WBC: 11.2 10*3/uL — ABNORMAL HIGH (ref 4.0–10.5)
nRBC: 0 % (ref 0.0–0.2)

## 2020-06-12 MED ORDER — ATORVASTATIN CALCIUM 40 MG PO TABS: 40.0000 mg | ORAL_TABLET | Freq: Every day | ORAL | 2 refills | Status: AC

## 2020-06-12 MED ORDER — METOPROLOL SUCCINATE ER 50 MG PO TB24: 150.0000 mg | ORAL_TABLET | Freq: Every evening | ORAL | 0 refills | Status: AC

## 2020-06-12 MED ORDER — RIVAROXABAN 20 MG PO TABS: 20.0000 mg | ORAL_TABLET | Freq: Every day | ORAL | 2 refills | Status: AC

## 2020-06-12 MED ORDER — DIGOXIN 250 MCG PO TABS: 0.2500 mg | ORAL_TABLET | Freq: Every day | ORAL | 0 refills | Status: AC

## 2020-06-12 MED ORDER — NICOTINE 14 MG/24HR TD PT24: 14.0000 mg | MEDICATED_PATCH | Freq: Every day | TRANSDERMAL | 0 refills | Status: AC

## 2020-06-12 MED ORDER — LOSARTAN POTASSIUM 25 MG PO TABS
25.0000 mg | ORAL_TABLET | Freq: Every day | ORAL | Status: DC
  Administered 2020-06-12: 25 mg via ORAL
  Filled 2020-06-12: qty 1

## 2020-06-12 MED ORDER — LOSARTAN POTASSIUM 25 MG PO TABS: 25.0000 mg | ORAL_TABLET | Freq: Every day | ORAL | 0 refills | Status: AC

## 2020-06-12 NOTE — Discharge Summary (Addendum)
Stroke Discharge Summary  Patient ID: Jason Gibbs   MRN: [id number]      DOB: 07/09/1975  Date of Admission: 06/07/2020 Date of Discharge: 06/12/2020  Attending Physician:  Stroke, Md, MD, Stroke MD Consultant(s):    cardiology Dr. Antoine Poche (new onset Afib) Patient's PCP:  [facility]  DISCHARGE DIAGNOSIS:  Active Problems:   Stroke determined by clinical assessment [facility])   Atrial fibrillation with RVR (HCC)   Acute ischemic stroke (HCC)   Allergies as of 06/12/2020   No Known Allergies     Medication List    TAKE these medications   atorvastatin 40 MG tablet Commonly known as: LIPITOR Take 1 tablet (40 mg total) by mouth daily. Start taking on: June 13, 2020   digoxin 0.25 MG tablet Commonly known as: LANOXIN Take 1 tablet (0.25 mg total) by mouth daily. Start taking on: June 13, 2020   losartan 25 MG tablet Commonly known as: COZAAR Take 1 tablet (25 mg total) by mouth daily. Start taking on: June 13, 2020   metoprolol succinate 50 MG 24 hr tablet Commonly known as: TOPROL-XL Take 3 tablets (150 mg total) by mouth every evening. ** DO NOT CRUSH **  (BETA BLOCKER)   nicotine 14 mg/24hr patch Commonly known as: NICODERM CQ - dosed in mg/24 hours Place 1 patch (14 mg total) onto the skin daily. Start taking on: June 13, 2020   rivaroxaban 20 MG Tabs tablet Commonly known as: XARELTO Take 1 tablet (20 mg total) by mouth daily with supper for 90 doses.       LABORATORY STUDIES CBC    Component Value Date/Time   WBC 11.2 (H) 06/12/2020 0228   RBC 6.50 (H) 06/12/2020 0228   HGB 19.3 (H) 06/12/2020 0228   HCT 58.5 (H) 06/12/2020 0228   PLT 160 06/12/2020 0228   MCV 90.0 06/12/2020 0228   MCH 29.7 06/12/2020 0228   MCHC 33.0 06/12/2020 0228   RDW 12.5 06/12/2020 0228   LYMPHSABS 1.9 06/07/2020 1747   MONOABS 0.6 06/07/2020 1747   EOSABS 0.1 06/07/2020 1747   BASOSABS 0.1 06/07/2020 1747   CMP    Component Value  Date/Time   NA 135 06/12/2020 0228   K 4.3 06/12/2020 0228   CL 103 06/12/2020 0228   CO2 22 06/12/2020 0228   GLUCOSE 91 06/12/2020 0228   BUN 20 06/12/2020 0228   CREATININE 0.93 06/12/2020 0228   CALCIUM 9.3 06/12/2020 0228   PROT 7.0 06/07/2020 1900   ALBUMIN 3.9 06/07/2020 1900   AST 69 (H) 06/07/2020 1900   ALT 59 (H) 06/07/2020 1900   ALKPHOS 59 06/07/2020 1900   BILITOT 0.7 06/07/2020 1900   GFRNONAA >60 06/12/2020 0228   COAGS Lab Results  Component Value Date   INR 1.2 06/07/2020   Lipid Panel    Component Value Date/Time   CHOL 214 (H) 06/08/2020 0330   TRIG 104 06/08/2020 0330   HDL 44 06/08/2020 0330   CHOLHDL 4.9 06/08/2020 0330   VLDL 21 06/08/2020 0330   LDLCALC 149 (H) 06/08/2020 0330   HgbA1C  Lab Results  Component Value Date   HGBA1C 5.6 06/08/2020   Urinalysis No results found for: COLORURINE, APPEARANCEUR, LABSPEC, PHURINE, GLUCOSEU, HGBUR, BILIRUBINUR, KETONESUR, PROTEINUR, UROBILINOGEN, NITRITE, LEUKOCYTESUR Urine Drug Screen     Component Value Date/Time   LABOPIA NONE DETECTED 06/08/2020 1224   COCAINSCRNUR NONE DETECTED 06/08/2020 1224   LABBENZ NONE DETECTED 06/08/2020 1224  AMPHETMU NONE DETECTED 06/08/2020 1224   THCU NONE DETECTED 06/08/2020 1224   LABBARB NONE DETECTED 06/08/2020 1224    Alcohol Level    Component Value Date/Time   ETH 71 (H) 06/07/2020 2227     SIGNIFICANT DIAGNOSTIC STUDIES CT ANGIO HEAD W OR WO CONTRAST  Result Date: 06/07/2020 CLINICAL DATA:  Stroke/TIA, assess extracranial arteries EXAM: CT ANGIOGRAPHY HEAD AND NECK TECHNIQUE: Multidetector CT imaging of the head and neck was performed using the standard protocol during bolus administration of intravenous contrast. Multiplanar CT image reconstructions and MIPs were obtained to evaluate the vascular anatomy. Carotid stenosis measurements (when applicable) are obtained utilizing NASCET criteria, using the distal internal carotid diameter as the  denominator. CONTRAST:  75mL OMNIPAQUE IOHEXOL 350 MG/ML SOLN COMPARISON:  None. FINDINGS: CT HEAD FINDINGS Brain: There is no mass, hemorrhage or extra-axial collection. The size and configuration of the ventricles and extra-axial CSF spaces are normal. There is no acute or chronic infarction. The brain parenchyma is normal. Skull: The visualized skull base, calvarium and extracranial soft tissues are normal. Sinuses/Orbits: No fluid levels or advanced mucosal thickening of the visualized paranasal sinuses. No mastoid or middle ear effusion. The orbits are normal. CTA NECK FINDINGS SKELETON: There is no bony spinal canal stenosis. No lytic or blastic lesion. OTHER NECK: Normal pharynx, larynx and major salivary glands. No cervical lymphadenopathy. Unremarkable thyroid gland. UPPER CHEST: No pneumothorax or pleural effusion. No nodules or masses. AORTIC ARCH: There is no calcific atherosclerosis of the aortic arch. There is no aneurysm, dissection or hemodynamically significant stenosis of the visualized portion of the aorta. Conventional 3 vessel aortic branching pattern. The visualized proximal subclavian arteries are widely patent. RIGHT CAROTID SYSTEM: Normal without aneurysm, dissection or stenosis. LEFT CAROTID SYSTEM: Normal without aneurysm, dissection or stenosis. VERTEBRAL ARTERIES: Left dominant configuration. Both origins are clearly patent. There is no dissection, occlusion or flow-limiting stenosis to the skull base (V1-V3 segments). CTA HEAD FINDINGS POSTERIOR CIRCULATION: --Vertebral arteries: Normal V4 segments. --Inferior cerebellar arteries: Normal. --Basilar artery: Normal. --Superior cerebellar arteries: Normal. --Posterior cerebral arteries (PCA): Distal left P2 segment occlusion (14:23). Normal right. ANTERIOR CIRCULATION: --Intracranial internal carotid arteries: Normal. --Anterior cerebral arteries (ACA): Normal. Both A1 segments are present. Patent anterior communicating artery (a-comm).  --Middle cerebral arteries (MCA): Normal. VENOUS SINUSES: As permitted by contrast timing, patent. ANATOMIC VARIANTS: None Review of the MIP images confirms the above findings. IMPRESSION: 1. Distal left P2 segment occlusion. 2. No other intracranial arterial occlusion or high-grade stenosis. 3. Normal cervical carotid and vertebral arteries. Electronically Signed   By: Deatra Robinson M.D.   On: 06/07/2020 21:22   CT ANGIO NECK W OR WO CONTRAST  Result Date: 06/07/2020 CLINICAL DATA:  Stroke/TIA, assess extracranial arteries EXAM: CT ANGIOGRAPHY HEAD AND NECK TECHNIQUE: Multidetector CT imaging of the head and neck was performed using the standard protocol during bolus administration of intravenous contrast. Multiplanar CT image reconstructions and MIPs were obtained to evaluate the vascular anatomy. Carotid stenosis measurements (when applicable) are obtained utilizing NASCET criteria, using the distal internal carotid diameter as the denominator. CONTRAST:  75mL OMNIPAQUE IOHEXOL 350 MG/ML SOLN COMPARISON:  None. FINDINGS: CT HEAD FINDINGS Brain: There is no mass, hemorrhage or extra-axial collection. The size and configuration of the ventricles and extra-axial CSF spaces are normal. There is no acute or chronic infarction. The brain parenchyma is normal. Skull: The visualized skull base, calvarium and extracranial soft tissues are normal. Sinuses/Orbits: No fluid levels or advanced mucosal thickening of the visualized  paranasal sinuses. No mastoid or middle ear effusion. The orbits are normal. CTA NECK FINDINGS SKELETON: There is no bony spinal canal stenosis. No lytic or blastic lesion. OTHER NECK: Normal pharynx, larynx and major salivary glands. No cervical lymphadenopathy. Unremarkable thyroid gland. UPPER CHEST: No pneumothorax or pleural effusion. No nodules or masses. AORTIC ARCH: There is no calcific atherosclerosis of the aortic arch. There is no aneurysm, dissection or hemodynamically significant  stenosis of the visualized portion of the aorta. Conventional 3 vessel aortic branching pattern. The visualized proximal subclavian arteries are widely patent. RIGHT CAROTID SYSTEM: Normal without aneurysm, dissection or stenosis. LEFT CAROTID SYSTEM: Normal without aneurysm, dissection or stenosis. VERTEBRAL ARTERIES: Left dominant configuration. Both origins are clearly patent. There is no dissection, occlusion or flow-limiting stenosis to the skull base (V1-V3 segments). CTA HEAD FINDINGS POSTERIOR CIRCULATION: --Vertebral arteries: Normal V4 segments. --Inferior cerebellar arteries: Normal. --Basilar artery: Normal. --Superior cerebellar arteries: Normal. --Posterior cerebral arteries (PCA): Distal left P2 segment occlusion (14:23). Normal right. ANTERIOR CIRCULATION: --Intracranial internal carotid arteries: Normal. --Anterior cerebral arteries (ACA): Normal. Both A1 segments are present. Patent anterior communicating artery (a-comm). --Middle cerebral arteries (MCA): Normal. VENOUS SINUSES: As permitted by contrast timing, patent. ANATOMIC VARIANTS: None Review of the MIP images confirms the above findings. IMPRESSION: 1. Distal left P2 segment occlusion. 2. No other intracranial arterial occlusion or high-grade stenosis. 3. Normal cervical carotid and vertebral arteries. Electronically Signed   By: Deatra Robinson M.D.   On: 06/07/2020 21:22   MR BRAIN WO CONTRAST  Result Date: 06/08/2020 CLINICAL DATA:  Stroke follow-up EXAM: MRI HEAD WITHOUT CONTRAST TECHNIQUE: Multiplanar, multiecho pulse sequences of the brain and surrounding structures were obtained without intravenous contrast. COMPARISON:  06/07/2020 FINDINGS: Brain: Small area abnormal diffusion restriction in the left thalamus and medial left temporal lobe. Punctate foci of abnormal diffusion restriction in the left occipital lobe. No acute or chronic hemorrhage. Skull and upper cervical spine: Normal calvarium and skull base. Visualized upper  cervical spine and soft tissues are normal. Sinuses/Orbits:No paranasal sinus fluid levels or advanced mucosal thickening. No mastoid or middle ear effusion. Normal orbits. IMPRESSION: There are a few new punctate foci of acute ischemia in the left PCA territory, in addition to the unchanged left thalamic and medial temporal lobe infarcts Electronically Signed   By: Deatra Robinson M.D.   On: 06/08/2020 19:25   MR BRAIN WO CONTRAST  Result Date: 06/07/2020 CLINICAL DATA:  Right-sided weakness EXAM: MRI HEAD WITHOUT CONTRAST TECHNIQUE: Multiplanar, multiecho pulse sequences of the brain and surrounding structures were obtained without intravenous contrast. COMPARISON:  CTA head neck 06/07/2020 FINDINGS: Brain: There are areas of abnormal diffusion restriction in the left thalamus and medial temporal lobe. No acute or chronic hemorrhage. There is multifocal hyperintense T2-weighted signal within the white matter. Parenchymal volume and CSF spaces are normal. The midline structures are normal. Vascular: Major flow voids are preserved. Skull and upper cervical spine: Normal calvarium and skull base. Visualized upper cervical spine and soft tissues are normal. Sinuses/Orbits:No paranasal sinus fluid levels or advanced mucosal thickening. No mastoid or middle ear effusion. Normal orbits. IMPRESSION: Small acute/subacute infarcts of the left thalamus and medial temporal lobe, both within the left PCA territory. No hemorrhage or mass effect. Electronically Signed   By: Deatra Robinson M.D.   On: 06/07/2020 22:01   ECHOCARDIOGRAM COMPLETE  Result Date: 06/08/2020    ECHOCARDIOGRAM REPORT   Patient Name:   Jason Gibbs Date of Exam: 06/08/2020 Medical Rec #:  [id number]  Height:       72.0 in Accession #:    [id number]    Weight:       200.6 lb Date of Birth:  [birth year]    BSA:          2.133 m Patient Age:    44 years      BP:           114/87 mmHg Patient Gender: M             HR:           95 bpm. Exam Location:   Inpatient Procedure: 2D Echo Indications:    Stroke; Atrial Fibrillation I48.91  History:        Patient has no prior history of Echocardiogram examinations.  Sonographer:    Thurman Coyer RDCS (AE) Referring Phys: [id number] SRISHTI L BHAGAT IMPRESSIONS  1. Left ventricular ejection fraction, by estimation, is 30 to 35%. The left ventricle has moderately decreased function. The left ventricle demonstrates global hypokinesis. Left ventricular diastolic function could not be evaluated.  2. Right ventricular systolic function is moderately reduced. The right ventricular size is mildly enlarged. Tricuspid regurgitation signal is inadequate for assessing PA pressure.  3. Left atrial size was moderately dilated.  4. The mitral valve is normal in structure. Mild to moderate mitral valve regurgitation.  5. The aortic valve is normal in structure. Aortic valve regurgitation is not visualized.  6. The inferior vena cava is dilated in size with >50% respiratory variability, suggesting right atrial pressure of 8 mmHg. FINDINGS  Left Ventricle: Left ventricular ejection fraction, by estimation, is 30 to 35%. The left ventricle has moderately decreased function. The left ventricle demonstrates global hypokinesis. The left ventricular internal cavity size was normal in size. There is no left ventricular hypertrophy. Left ventricular diastolic function could not be evaluated due to atrial fibrillation. Left ventricular diastolic function could not be evaluated. Right Ventricle: The right ventricular size is mildly enlarged. No increase in right ventricular wall thickness. Right ventricular systolic function is moderately reduced. Tricuspid regurgitation signal is inadequate for assessing PA pressure. Left Atrium: Left atrial size was moderately dilated. Right Atrium: Right atrial size was normal in size. Pericardium: There is no evidence of pericardial effusion. Mitral Valve: The mitral valve is normal in structure. Mild to  moderate mitral valve regurgitation, with centrally-directed jet. Tricuspid Valve: The tricuspid valve is normal in structure. Tricuspid valve regurgitation is not demonstrated. Aortic Valve: The aortic valve is normal in structure. Aortic valve regurgitation is not visualized. Pulmonic Valve: The pulmonic valve was grossly normal. Pulmonic valve regurgitation is not visualized. Aorta: The aortic root and ascending aorta are structurally normal, with no evidence of dilitation. Venous: The inferior vena cava is dilated in size with greater than 50% respiratory variability, suggesting right atrial pressure of 8 mmHg. IAS/Shunts: No atrial level shunt detected by color flow Doppler.  LEFT VENTRICLE PLAX 2D LVIDd:         5.00 cm LVIDs:         4.10 cm LV PW:         1.00 cm LV IVS:        1.00 cm LVOT diam:     2.10 cm LV SV:         52 LV SV Index:   24 LVOT Area:     3.46 cm  RIGHT VENTRICLE RV Basal diam:  4.40 cm RV S prime:     7.72 cm/s TAPSE (M-mode): 1.1  cm LEFT ATRIUM           Index       RIGHT ATRIUM           Index LA diam:      4.50 cm 2.11 cm/m  RA Area:     16.00 cm LA Vol (A2C): 60.3 ml 28.27 ml/m RA Volume:   41.20 ml  19.31 ml/m LA Vol (A4C): 60.4 ml 28.33 ml/m  AORTIC VALVE LVOT Vmax:   93.70 cm/s LVOT Vmean:  61.067 cm/s LVOT VTI:    0.149 m  AORTA Ao Root diam: 3.50 cm MR Peak grad: 77.4 mmHg MR Mean grad: 55.0 mmHg   SHUNTS MR Vmax:      440.00 cm/s Systemic VTI:  0.15 m MR Vmean:     357.0 cm/s  Systemic Diam: 2.10 cm Thurmon FairMihai Croitoru MD Electronically signed by Thurmon FairMihai Croitoru MD Signature Date/Time: 06/08/2020/12:42:02 PM    Final    ECHO TEE  Result Date: 06/11/2020    TRANSESOPHOGEAL ECHO REPORT   Patient Name:   Jason Gibbs Date of Exam: 06/11/2020 Medical Rec #:  [id number]     Height: Accession #:    [id number]    Weight:       195.8 lb Date of Birth:  Dec 29, 1975    BSA:          1.991 m Patient Age:    44 years      BP:           143/99 mmHg Patient Gender: M             HR:            132 bpm. Exam Location:  Inpatient Procedure: Transesophageal Echo, Color Doppler, Cardiac Doppler, Intracardiac            Opacification Agent and 3D Echo Indications:     atrial fibrillation  History:         Patient has prior history of Echocardiogram examinations, most                  recent 06/08/2020. Stroke; Arrythmias:Atrial Fibrillation.  Sonographer:     Delcie RochLauren Pennington Referring Phys:  [id number] Cyndi BenderXIKA ZHAO Diagnosing Phys: Kristeen MissPhilip Nahser MD PROCEDURE: After discussion of the risks and benefits of a TEE, an informed consent was obtained from the patient. The transesophogeal probe was passed without difficulty through the esophogus of the patient. Imaged were obtained with the patient in a left lateral decubitus position. Sedation performed by different physician. The patient was monitored while under deep sedation. Anesthestetic sedation was provided intravenously by Anesthesiology: 400mg  of Propofol. The patient developed no complications during the procedure. IMPRESSIONS  1. Left ventricular ejection fraction, by estimation, is 20 to 25%. The left ventricle has severely decreased function. The left ventricle demonstrates global hypokinesis.  2. Right ventricular systolic function is normal. The right ventricular size is normal.  3. There is a medium sized , highly mobile mass in the LAA. This mass is very concerning for a thrombus. There is extensive spontaneous contrast present in the Left atrium. 3D images of the left atrial appendage were obtained. We give Definity contrast to further define the appendage. The mass was not visualized all that well following Definity contrast         Cardioversion was not performed.     . A left atrial/left atrial appendage thrombus was detected.  4. The mitral valve is grossly normal. Mild mitral valve regurgitation.  5. The  aortic valve is grossly normal. Aortic valve regurgitation is not visualized. FINDINGS  Left Ventricle: Left ventricular ejection fraction, by  estimation, is 20 to 25%. The left ventricle has severely decreased function. The left ventricle demonstrates global hypokinesis. Definity contrast agent was given IV to delineate the left ventricular endocardial borders. The left ventricular internal cavity size was normal in size. Right Ventricle: The right ventricular size is normal. No increase in right ventricular wall thickness. Right ventricular systolic function is normal. Left Atrium: There is a medium sized , highly mobile mass in the LAA. This mass is very concerning for a thrombus. There is extensive spontaneous contrast present in the Left atrium. 3D images of the left atrial appendage were obtained. We give Definity contrast to further define the appendage. The mass was not visualized all that well following Definity contrast Cardioversion was not performed. Left atrial size was normal in size. Spontaneous echo contrast was present. A left atrial/left atrial appendage thrombus was detected. Right Atrium: Right atrial size was normal in size. Pericardium: There is no evidence of pericardial effusion. Mitral Valve: The mitral valve is grossly normal. Mild mitral valve regurgitation. There is no evidence of mitral valve vegetation. Tricuspid Valve: The tricuspid valve is grossly normal. Tricuspid valve regurgitation is trivial. Aortic Valve: The aortic valve is grossly normal. Aortic valve regurgitation is not visualized. Pulmonic Valve: The pulmonic valve was grossly normal. Pulmonic valve regurgitation is not visualized. Aorta: The aortic root and ascending aorta are structurally normal, with no evidence of dilitation. IAS/Shunts: The atrial septum is grossly normal. Kristeen Miss MD Electronically signed by Kristeen Miss MD Signature Date/Time: 06/11/2020/3:35:04 PM    Final    CT HEAD CODE STROKE WO CONTRAST  Result Date: 06/07/2020 CLINICAL DATA:  Code stroke. Acute neuro deficit. Slurred speech right facial droop EXAM: CT HEAD WITHOUT CONTRAST  TECHNIQUE: Contiguous axial images were obtained from the base of the skull through the vertex without intravenous contrast. COMPARISON:  None. FINDINGS: Brain: No evidence of acute infarction, hemorrhage, hydrocephalus, extra-axial collection or mass lesion/mass effect. Vascular: Atherosclerotic calcification distal vertebral arteries bilaterally. Negative for hyperdense vessel Skull: Negative Sinuses/Orbits: Negative Other: None ASPECTS ([place] Stroke Program Early CT Score) - Ganglionic level infarction (caudate, lentiform nuclei, internal capsule, insula, M1-M3 cortex): 7 - Supraganglionic infarction (M4-M6 cortex): 3 Total score (0-10 with 10 being normal): 10 IMPRESSION: 1. No acute intracranial abnormality. 2. ASPECTS is 10 3. Code stroke imaging results were communicated on 06/07/2020 at 6:03 pm to provider Bhagat via text page Electronically Signed   By: Marlan Palau M.D.   On: 06/07/2020 18:04      HISTORY OF PRESENT ILLNESS Jason Gibbs is a 45 y.o. male with a past medical history significant for daily alcohol use, lack of regular medical care, smoker (3 to 4 cigarettes daily), presenting with acute onset right-sided weakness.  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  He additionally had numbness in the right arm and leg, but denies any vision issues, speech or language issues.  He was found to have a left PCA stroke secondary to new diagnosed atrial fibrillation.  He was given tPA on 4/18 at 1810.     HOSPITAL COURSE Stroke:  left PCA infarct embolic secondary to new diagnosed A. fib  CT head no acute abnormality  CTA head and  neck left P2 segmental occlusion  MRI left thalamic and left hippocampal infarct  Repeat MRI 24-hour tPA no change  2D Echo EF 30 to 35%  LDL 149  HgbA1c 5.6  UDS negative  No  antithrombotic prior to admission, was on Heparin infusion. now on Xarelto  daily.   Therapy recommendations: none   A. fib RVR  Newly diagnosed  TEE (4/23) showed LAA thrombus, Patient not a candidate for DCCV  Continue Xeralto,   Toprol  q pm and digoxin 0.25mg  daily for rate control  Cardiology plans to repeat TEE guided cardioversion in 6 to 8 weeks the repeat echocardiogram 3 months after TEE guided cardioversion.  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  Currently on Toprol, digoxin and adding losartan  daily  Patient educated on monitoring BP daily and record results  Hypertension  Stable  On toprol, digoxin, losartan  Long term BP goal normotensive  Hyperlipidemia  Home meds: None  LDL 149, goal < 70  Now on Lipitor 40  Continue statin at discharge  Tobacco abuse  Current smoker  Smoking cessation counseling provided  On nicotine patch  Alcohol abuse  FA/MVI/B1 while inpatient  Alcohol limitation education provided  Other Active Problems  Acute kidney injury: likely due to dehydration, creatinine 0.81->1.13->0.96, encourage continue po intake  Polycythemia - likely secondary to smoking and dehydration, followup out patient      DISCHARGE EXAM Blood pressure 139/86, pulse 63, temperature 99 F (37.2 C), temperature source Oral, resp. rate 18, weight 88.8 kg, SpO2 97 %.  General - Well nourished, well developed, in no apparent distress. Cardiovascular - irregularly irregular heart rate and rhythm.  Neuro - awake, alert, oriented x4.  No aphasia, no dysarthria, speech fluency improved, following all simple commands. Able to name and repeat. No gaze palsy,  tracking bilaterally, visual field exam showed right upper quadrantanopia, right lower quadrant decreased visual acuity but improving. PERRL. No facial droop. Tongue midline. Bilateral UEs 5/5, no drift. Bilaterally LEs 5/5, no drift. Sensation symmetrical bilaterally, b/l FTN and HTS intact, gait not tested.   Discharge Diet       Diet   Diet Heart Room service appropriate? Yes; Fluid consistency: Thin   liquids  DISCHARGE PLAN  Disposition:  Home  Please fill all your prescriptions and take them as directed.  Xarelto coupon provided to the patient.  MATCH arranged by SW.  Xarelto (rivaroxaban) daily for secondary stroke prevention and atrial fibrillation.  Ongoing stroke risk factor control by Primary Care Physician at time of discharge  Follow-up PCP at [facility] on 07/26/2020.  They will call you if a sooner appointment opens up.  Follow-up in [facility] in 4 weeks, office to schedule an appointment.   Follow-up with Cardiology on 06/25/2020  Lab work at [facility] center on  Jun 21, 2020.  55 minutes were spent preparing discharge.  Lissy Olivencia-Simmons, ACNP-BC Stroke NP   Dr. Gerilyn Pilgrim -- The patient was seen and examined by me; notes, chart and tests reviewed and discussed with the practice provider, other providers, patient, and family.

## 2020-06-12 NOTE — Progress Notes (Signed)
Progress Note  Patient Name: Jason Gibbs Date of Encounter: 06/12/2020  [facility] Cardiologist: [place] office  Subjective   No CP or dyspnea  Inpatient Medications    Scheduled Meds: . atorvastatin  40 mg Oral Daily  . Chlorhexidine Gluconate Cloth  6 each Topical Daily  . digoxin  0.25 mg Oral Daily  . folic acid  1 mg Oral Daily  . metoprolol succinate  150 mg Oral QPM  . multivitamin with minerals  1 tablet Oral Daily  . nicotine  14 mg Transdermal Daily  . rivaroxaban  20 mg Oral Q supper  . senna-docusate  1 tablet Oral BID  . thiamine  100 mg Oral Daily   Continuous Infusions:  PRN Meds: acetaminophen **OR** acetaminophen (TYLENOL) oral liquid 160 mg/5 mL **OR** acetaminophen, labetalol   Vital Signs    Vitals:   06/11/20 1949 06/12/20 0020 06/12/20 0331 06/12/20 0739  BP: 132/86 122/86 125/86 113/73  Pulse: 92 (!) 55 66 97  Resp: 18 17 18 18   Temp: 97.6 F (36.4 C) 98.4 F (36.9 C) 98.7 F (37.1 C) 99 F (37.2 C)  TempSrc: Oral Oral Oral Oral  SpO2: 97% 98% 97% 94%  Weight:        Intake/Output Summary (Last 24 hours) at 06/12/2020 0941 Last data filed at 06/11/2020 2025 Gross per 24 hour  Intake 120 ml  Output 200 ml  Net -80 ml   Last 3 Weights 06/09/2020 06/07/2020  Weight (lbs) 195 lb 12.3 oz 200 lb 9.9 oz  Weight (kg) 88.8 kg 91 kg      Telemetry    Atrial fibrillation rate controlled - Personally Reviewed   Physical Exam   GEN: No acute distress.   Neck: No JVD Cardiac: irregular Respiratory: Clear to auscultation bilaterally. GI: Soft, nontender, non-distended  MS: No edema Neuro:  Nonfocal  Psych: Normal affect   Labs     Chemistry Recent Labs  Lab 06/07/20 1900 06/08/20 0330 06/10/20 0451 06/11/20 0451 06/12/20 0228  NA 133*   < > 135 137 135  K 3.7   < > 4.2 4.1 4.3  CL 100   < > 103 104 103  CO2 20*   < > 23 20* 22  GLUCOSE 125*   < > 96 96 91  BUN 9   < > 15 16 20   CREATININE 0.68   < > 1.13 0.96  0.93  CALCIUM 8.4*   < > 9.4 9.6 9.3  PROT 7.0  --   --   --   --   ALBUMIN 3.9  --   --   --   --   AST 69*  --   --   --   --   ALT 59*  --   --   --   --   ALKPHOS 59  --   --   --   --   BILITOT 0.7  --   --   --   --   GFRNONAA >60   < > >60 >60 >60  ANIONGAP 13   < > 9 13 10    < > = values in this interval not displayed.     Hematology Recent Labs  Lab 06/10/20 0451 06/11/20 0451 06/12/20 0228  WBC 7.1 8.0 11.2*  RBC 6.26* 6.79* 6.50*  HGB 18.9* 20.2* 19.3*  HCT 57.0* 60.7* 58.5*  MCV 91.1 89.4 90.0  MCH 30.2 29.7 29.7  MCHC 33.2 33.3 33.0  RDW 12.5  12.4 12.5  PLT 136* 153 160    Radiology    ECHO TEE  Result Date: 06/11/2020    TRANSESOPHOGEAL ECHO REPORT   Patient Name:   ALEXIE SAMSON Date of Exam: 06/11/2020 Medical Rec #:  [id number]     Height: Accession #:    [id number]    Weight:       195.8 lb Date of Birth:  [birth year]    BSA:          1.991 m Patient Age:    45 years      BP:           143/99 mmHg Patient Gender: M             HR:           132 bpm. Exam Location:  Inpatient Procedure: Transesophageal Echo, Color Doppler, Cardiac Doppler, Intracardiac            Opacification Agent and 3D Echo Indications:     atrial fibrillation  History:         Patient has prior history of Echocardiogram examinations, most                  recent 06/08/2020. Stroke; Arrythmias:Atrial Fibrillation.  Sonographer:     Delcie Roch Referring Phys:  [id number] Cyndi Bender Diagnosing Phys: Kristeen Miss MD PROCEDURE: After discussion of the risks and benefits of a TEE, an informed consent was obtained from the patient. The transesophogeal probe was passed without difficulty through the esophogus of the patient. Imaged were obtained with the patient in a left lateral decubitus position. Sedation performed by different physician. The patient was monitored while under deep sedation. Anesthestetic sedation was provided intravenously by Anesthesiology: 400mg  of Propofol. The patient developed  no complications during the procedure. IMPRESSIONS  1. Left ventricular ejection fraction, by estimation, is 20 to 25%. The left ventricle has severely decreased function. The left ventricle demonstrates global hypokinesis.  2. Right ventricular systolic function is normal. The right ventricular size is normal.  3. There is a medium sized , highly mobile mass in the LAA. This mass is very concerning for a thrombus. There is extensive spontaneous contrast present in the Left atrium. 3D images of the left atrial appendage were obtained. We give Definity contrast to further define the appendage. The mass was not visualized all that well following Definity contrast         Cardioversion was not performed.     . A left atrial/left atrial appendage thrombus was detected.  4. The mitral valve is grossly normal. Mild mitral valve regurgitation.  5. The aortic valve is grossly normal. Aortic valve regurgitation is not visualized. FINDINGS  Left Ventricle: Left ventricular ejection fraction, by estimation, is 20 to 25%. The left ventricle has severely decreased function. The left ventricle demonstrates global hypokinesis. Definity contrast agent was given IV to delineate the left ventricular endocardial borders. The left ventricular internal cavity size was normal in size. Right Ventricle: The right ventricular size is normal. No increase in right ventricular wall thickness. Right ventricular systolic function is normal. Left Atrium: There is a medium sized , highly mobile mass in the LAA. This mass is very concerning for a thrombus. There is extensive spontaneous contrast present in the Left atrium. 3D images of the left atrial appendage were obtained. We give Definity contrast to further define the appendage. The mass was not visualized all that well following Definity contrast Cardioversion was not performed. Left atrial size  was normal in size. Spontaneous echo contrast was present. A left atrial/left atrial appendage  thrombus was detected. Right Atrium: Right atrial size was normal in size. Pericardium: There is no evidence of pericardial effusion. Mitral Valve: The mitral valve is grossly normal. Mild mitral valve regurgitation. There is no evidence of mitral valve vegetation. Tricuspid Valve: The tricuspid valve is grossly normal. Tricuspid valve regurgitation is trivial. Aortic Valve: The aortic valve is grossly normal. Aortic valve regurgitation is not visualized. Pulmonic Valve: The pulmonic valve was grossly normal. Pulmonic valve regurgitation is not visualized. Aorta: The aortic root and ascending aorta are structurally normal, with no evidence of dilitation. IAS/Shunts: The atrial septum is grossly normal. Kristeen Miss MD Electronically signed by Kristeen Miss MD Signature Date/Time: 06/11/2020/3:35:04 PM    Final     Patient Profile     Devontaye Ground a 45 y.o.malewith no known PMH, ETOH abuse, tobacco abuse, who presented with acute right sided weakness and paresthesia, difficulty ambulation on 06/07/20. He wasgivenTPA4/18/2022. Cardiology is following for A fib with RVRand heart failure. Echocardiogram this admission shows ejection fraction 30 to 35%, moderate RV dysfunction with mild right ventricular enlargement, moderate left atrial enlargement, mild to moderate mitral regurgitation.  TEE revealed ejection fraction 20 to 25% and left atrial appendage thrombus, mild mitral regurgitation.  Assessment & Plan   1 persistent atrial fibrillation-patient remains in atrial fibrillation.  Transesophageal echocardiogram yesterday demonstrated left atrial appendage thrombus and he was therefore not cardioverted.  We will plan to continue Toprol and digoxin for rate control (his heart rate is reasonably well controlled on telemetry).  Continue Xarelto.  Would plan to repeat TEE guided cardioversion in 6 to 8 weeks.  2 cardiomyopathy-etiology unclear but may be tachycardia mediated.  Continue digoxin and  Toprol.  We will add losartan 25 mg daily.  Increase as an outpatient as tolerated by blood pressure.  Hopefully LV function will improve once sinus reestablished and heart rate controlled.  If not would need ischemia evaluation.  Would plan to repeat echocardiogram 3 months after TEE guided cardioversion.  3 CVA-likely embolic from atrial fibrillation.  Continue Xarelto.  4 hyperlipidemia-continue statin.  Check lipids and liver in 12 weeks.  5 alcohol abuse-patient counseled on avoiding.  6 tobacco abuse-patient counseled on discontinuing.  7 polycythemia-hemoglobin has increased since admission.  Will need follow-up with primary care as an outpatient.  Patient can be discharged from a cardiac standpoint.  We will arrange follow-up in our [place] office in 1 to 2 weeks.  Needs potassium and renal function checked in 1 week.  We will sign off.  Please call with questions.  Olga Millers, MD   For questions or updates, please contact [facility] Please consult [email or web address] for contact info under        Signed, Olga Millers, MD  06/12/2020, 9:41 AM

## 2020-06-12 NOTE — Progress Notes (Signed)
Pt safely discharged. Discharge packet provided with teach-back method. VS wnL and as per flow. IVs removed, Pt verbalized understanding. All questions and concerns addressed. Awaiting on transport.  

## 2020-06-12 NOTE — Care Management (Signed)
Patient provided with MATCH letter, Jason Gibbs 30 day card and written prescriptions. Reviewed pharmacies he can use. He understands to call [facility] Monday to confirm appointment and follow up there for refills at low cost pharmacy.

## 2020-06-14 ENCOUNTER — Encounter (HOSPITAL_COMMUNITY): Payer: Self-pay | Admitting: Cardiovascular Disease

## 2020-06-14 NOTE — Anesthesia Postprocedure Evaluation (Signed)
Anesthesia Post Note  Patient: Jason Gibbs  Procedure(s) Performed: TRANSESOPHAGEAL ECHOCARDIOGRAM (TEE) (N/A )     Patient location during evaluation: Endoscopy Anesthesia Type: MAC Level of consciousness: awake and alert Pain management: pain level controlled Vital Signs Assessment: post-procedure vital signs reviewed and stable Respiratory status: spontaneous breathing, nonlabored ventilation, respiratory function stable and patient connected to nasal cannula oxygen Cardiovascular status: stable and blood pressure returned to baseline Postop Assessment: no apparent nausea or vomiting Anesthetic complications: no   No complications documented.  Last Vitals:  Vitals:   06/12/20 1050 06/12/20 1115  BP: 112/82 139/86  Pulse: 63   Resp:  18  Temp:  37.2 C  SpO2: 98% 97%    Last Pain:  Vitals:   06/12/20 1115  TempSrc: Oral  PainSc:                  [place]

## 2020-06-21 ENCOUNTER — Ambulatory Visit: Payer: Self-pay | Admitting: Physician Assistant

## 2020-06-24 NOTE — Progress Notes (Deleted)
Cardiology Office Note:    Date:  06/25/2020   ID:  Jason Gibbs, DOB [birth year], MRN [id number]  PCP:  Pcp, No  [facility] Cardiologist:  None  [facility] Electrophysiologist:  None   Referring MD: No ref. provider found   Chief Complaint: 2 week follow-up  History of Present Illness:    Jason Gibbs is a 45 y.o. male with a hx of ETOH abuse, lack of regular medical care who presents for hospital follow-up.   [place] was recently admitted for stroke s/p TPA. Cardiology saw for new onset Afib RVR. Echo showed LVEF 30-35%, mod RV dysfunction with mild right ventricular enlargement, moderate left atrial enlargement, mild to mod MR. TEE showed EF 20-25%, left atrial appendage thrombus, mild MR. Was not candidate for cardioversion due to thrombus. Started on Toprol, digoxin, Xarelto. Plan was for repeat TEE in 6-8 weeks and repeat echo in 3 months. Patient was discharged 06/12/20.  Today,     Past Medical History:  Diagnosis Date  . Dysrhythmia     Past Surgical History:  Procedure Laterality Date  . TEE WITHOUT CARDIOVERSION N/A 06/11/2020   Procedure: TRANSESOPHAGEAL ECHOCARDIOGRAM (TEE);  Surgeon: Elease Hashimoto Deloris Ping, MD;  Location: [facility] ENDOSCOPY;  Service: Cardiovascular;  Laterality: N/A;    Current Medications: No outpatient medications have been marked as taking for the 06/25/20 encounter (Appointment) with Fransico Michael, Cristle Jared H, PA-C.     Allergies:   Patient has no known allergies.   Social History   Socioeconomic History  . Marital status: Unknown    Spouse name: Not on file  . Number of children: Not on file  . Years of education: Not on file  . Highest education level: Not on file  Occupational History  . Not on file  Tobacco Use  . Smoking status: Current Every Day Smoker    Types: Cigarettes  . Smokeless tobacco: Never Used  . Tobacco comment: 3 a day   Vaping Use  . Vaping Use: Never used  Substance and Sexual Activity  . Alcohol use: Yes     Alcohol/week: 6.0 standard drinks    Types: 6 Cans of beer per week    Comment: 6 beers daily   . Drug use: Yes    Types: Marijuana    Comment: once in awhile   . Sexual activity: Not on file  Other Topics Concern  . Not on file  Social History Narrative  . Not on file   Social Determinants of Health   Financial Resource Strain: Not on file  Food Insecurity: Not on file  Transportation Needs: Not on file  Physical Activity: Not on file  Stress: Not on file  Social Connections: Not on file     Family History: The patient's ***family history is not on file.  ROS:   Please see the history of present illness.    *** All other systems reviewed and are negative.  EKGs/Labs/Other Studies Reviewed:    The following studies were reviewed today:  Echo TEE 06/11/20 1. Left ventricular ejection fraction, by estimation, is 20 to 25%. The  left ventricle has severely decreased function. The left ventricle  demonstrates global hypokinesis.  2. Right ventricular systolic function is normal. The right ventricular  size is normal.  3. There is a medium sized , highly mobile mass in the LAA. This mass is  very concerning for a thrombus. There is extensive spontaneous contrast  present in the Left atrium. 3D images of the left atrial appendage were  obtained. We give Definity contrast  to further define the appendage. The mass was not visualized all that well  following Definity contrast      Cardioversion was not performed.   . A left atrial/left atrial appendage thrombus was detected.  4. The mitral valve is grossly normal. Mild mitral valve regurgitation.  5. The aortic valve is grossly normal. Aortic valve regurgitation is not  visualized.   Echo 06/08/20 1. Left ventricular ejection fraction, by estimation, is 30 to 35%. The  left ventricle has moderately decreased function. The left ventricle  demonstrates global hypokinesis. Left ventricular diastolic function  could  not be evaluated.  2. Right ventricular systolic function is moderately reduced. The right  ventricular size is mildly enlarged. Tricuspid regurgitation signal is  inadequate for assessing PA pressure.  3. Left atrial size was moderately dilated.  4. The mitral valve is normal in structure. Mild to moderate mitral valve  regurgitation.  5. The aortic valve is normal in structure. Aortic valve regurgitation is  not visualized.  6. The inferior vena cava is dilated in size with >50% respiratory  variability, suggesting right atrial pressure of 8 mmHg.   EKG:  EKG is *** ordered today.  The ekg ordered today demonstrates ***  Recent Labs: 06/07/2020: ALT 59 06/08/2020: Magnesium 2.0; TSH 1.451 06/12/2020: BUN 20; Creatinine, Ser 0.93; Hemoglobin 19.3; Platelets 160; Potassium 4.3; Sodium 135  Recent Lipid Panel    Component Value Date/Time   CHOL 214 (H) 06/08/2020 0330   TRIG 104 06/08/2020 0330   HDL 44 06/08/2020 0330   CHOLHDL 4.9 06/08/2020 0330   VLDL 21 06/08/2020 0330   LDLCALC 149 (H) 06/08/2020 0330     Risk Assessment/Calculations:   {Does this patient have ATRIAL FIBRILLATION?:[id number]}   Physical Exam:    VS:  There were no vitals taken for this visit.    Wt Readings from Last 3 Encounters:  06/09/20 195 lb 12.3 oz (88.8 kg)     GEN: *** Well nourished, well developed in no acute distress HEENT: Normal NECK: No JVD; No carotid bruits LYMPHATICS: No lymphadenopathy CARDIAC: ***RRR, no murmurs, rubs, gallops RESPIRATORY:  Clear to auscultation without rales, wheezing or rhonchi  ABDOMEN: Soft, non-tender, non-distended MUSCULOSKELETAL:  No edema; No deformity  SKIN: Warm and dry NEUROLOGIC:  Alert and oriented x 3 PSYCHIATRIC:  Normal affect   ASSESSMENT:    1. Chronic systolic heart failure (HCC)   2. Paroxysmal A-fib (HCC)   3. Essential hypertension   4. Hyperlipidemia, mixed   5. Cardiomyopathy, unspecified type (HCC)   6. Acute  embolic stroke (HCC)    PLAN:    In order of problems listed above:  Acute stroke Left PCA infarct embolic due newly diagnosed Afib. S/p TPA. Repeat MRI with no change. Echo showed EF 30-35%, Now on Xarelto 20mg  daily.   New afib TEE 4/23 sowed LAA thrombus, patient was not a cardioversion candidate. Toprol 150mg  and digoxin 0.25mg  daily. Plan for   HFrEF CM Suspected tachy-induced vs ETOH. EF 30-35% wth global hypokinesis, mod reduced R function, mildly enlarged RV, mild to mod MR. Contineu torpol, digoxin, losartan  HTN  HLD LDL 149. Lipitor  Tobacco use Cessation encouraed  Alchol use  AKI BMET  Disposition: Follow up {follow up:15908} with ***   Shared Decision Making/Informed Consent   {Are you ordering a CV Procedure (e.g. stress test, cath, DCCV, TEE, etc)?   Press F2        :5/23  Signed, Lylee Corrow Ardelle Lesches  06/25/2020 7:38 AM    [facility]

## 2020-06-25 ENCOUNTER — Ambulatory Visit: Payer: Self-pay | Admitting: Medical

## 2020-06-25 DIAGNOSIS — I10 Essential (primary) hypertension: Secondary | ICD-10-CM

## 2020-06-25 DIAGNOSIS — E782 Mixed hyperlipidemia: Secondary | ICD-10-CM

## 2020-06-25 DIAGNOSIS — I429 Cardiomyopathy, unspecified: Secondary | ICD-10-CM

## 2020-06-25 DIAGNOSIS — I5022 Chronic systolic (congestive) heart failure: Secondary | ICD-10-CM

## 2020-06-25 DIAGNOSIS — I639 Cerebral infarction, unspecified: Secondary | ICD-10-CM

## 2020-06-25 DIAGNOSIS — I480 Paroxysmal atrial fibrillation: Secondary | ICD-10-CM

## 2020-07-26 ENCOUNTER — Ambulatory Visit: Payer: Self-pay | Admitting: Cardiology

## 2020-07-26 ENCOUNTER — Inpatient Hospital Stay: Payer: Self-pay | Admitting: Family Medicine

## 2020-09-02 ENCOUNTER — Ambulatory Visit: Payer: Self-pay | Admitting: *Deleted

## 2020-09-02 NOTE — Telephone Encounter (Signed)
Patient is calling to report he missed his hospital follow up and he needs to reschedule. Patient states he has had tingling in his R arm and leg for 3 weeks. Advised patient UC-but his is insistent that he wants to make appointment- call to office- they have rescheduled appointment- patient notified and advised he still needs to be seen at [facility] to rule out another event.

## 2020-09-02 NOTE — Telephone Encounter (Signed)
Reason for Disposition  [1] Numbness (i.e., loss of sensation) of the face, arm / hand, or leg / foot on one side of the body AND [2] gradual onset (e.g., days to weeks) AND [3] present now  Answer Assessment - Initial Assessment Questions 1. SYMPTOM: "What is the main symptom you are concerned about?" (e.g., weakness, numbness)     Request arm and leg checked- tingling - R side 2. ONSET: "When did this start?" (minutes, hours, days; while sleeping)     3 weeks 3. LAST NORMAL: "When was the last time you (the patient) were normal (no symptoms)?"     3 weeks ago 4. PATTERN "Does this come and go, or has it been constant since it started?"  "Is it present now?"     constant 5. CARDIAC SYMPTOMS: "Have you had any of the following symptoms: chest pain, difficulty breathing, palpitations?"     no 6. NEUROLOGIC SYMPTOMS: "Have you had any of the following symptoms: headache, dizziness, vision loss, double vision, changes in speech, unsteady on your feet?"     no 7. OTHER SYMPTOMS: "Do you have any other symptoms?"     no 8. PREGNANCY: "Is there any chance you are pregnant?" "When was your last menstrual period?"     N/a  Protocols used: Neurologic Deficit-A-AH

## 2020-09-14 ENCOUNTER — Other Ambulatory Visit: Payer: Self-pay

## 2020-09-14 NOTE — Patient Outreach (Signed)
[facility]) Care Management  09/14/2020  Skylan Lara [birth year] [id number]   First telephone outreach attempt to obtain mRS. No answer. Left message for returned call.  Vanice Sarah [facility] Management Assistant [phone]

## 2020-09-16 ENCOUNTER — Inpatient Hospital Stay (INDEPENDENT_AMBULATORY_CARE_PROVIDER_SITE_OTHER): Payer: Self-pay | Admitting: Primary Care

## 2020-09-20 ENCOUNTER — Other Ambulatory Visit: Payer: Self-pay

## 2020-09-20 NOTE — Patient Outreach (Signed)
[facility]) Care Management  09/20/2020  Jason Gibbs 1975/10/07 [id number]   Second telephone outreach attempt to obtain mRS. No answer. Left message for returned call.  Vanice Sarah [facility] Management Assistant [phone]

## 2020-09-23 ENCOUNTER — Other Ambulatory Visit: Payer: Self-pay

## 2020-09-23 NOTE — Patient Outreach (Signed)
[facility]) Care Management  09/23/2020  Behr Cislo 1975/11/20 [id number]   3 outreach attempts were completed to obtain mRs. mRs could not be obtained because patient never returned my calls. mRs=7    Vanice Sarah Care Management Assistant [phone]
